# Patient Record
Sex: Male | Born: 1950 | Race: White | Hispanic: No | Marital: Married | Smoking: Never smoker
Health system: Southern US, Community
[De-identification: ages and names within clinical notes are randomized; demographics above are authoritative.]

## PROBLEM LIST (undated history)

## (undated) DIAGNOSIS — Z87442 Personal history of urinary calculi: Secondary | ICD-10-CM

## (undated) DIAGNOSIS — N400 Enlarged prostate without lower urinary tract symptoms: Secondary | ICD-10-CM

## (undated) HISTORY — PX: TONSILLECTOMY AND ADENOIDECTOMY: SUR1326

---

## 1980-08-22 HISTORY — PX: SHOULDER SURGERY: SHX246

## 2012-08-22 HISTORY — PX: EYE SURGERY: SHX253

## 2015-01-12 DIAGNOSIS — L720 Epidermal cyst: Secondary | ICD-10-CM | POA: Diagnosis not present

## 2015-01-12 DIAGNOSIS — Z1283 Encounter for screening for malignant neoplasm of skin: Secondary | ICD-10-CM | POA: Diagnosis not present

## 2015-01-12 DIAGNOSIS — D485 Neoplasm of uncertain behavior of skin: Secondary | ICD-10-CM | POA: Diagnosis not present

## 2015-01-12 DIAGNOSIS — D2239 Melanocytic nevi of other parts of face: Secondary | ICD-10-CM | POA: Diagnosis not present

## 2015-01-12 DIAGNOSIS — L821 Other seborrheic keratosis: Secondary | ICD-10-CM | POA: Diagnosis not present

## 2015-01-12 DIAGNOSIS — L700 Acne vulgaris: Secondary | ICD-10-CM | POA: Diagnosis not present

## 2015-01-12 DIAGNOSIS — D229 Melanocytic nevi, unspecified: Secondary | ICD-10-CM | POA: Diagnosis not present

## 2015-01-12 DIAGNOSIS — L570 Actinic keratosis: Secondary | ICD-10-CM | POA: Diagnosis not present

## 2015-01-12 DIAGNOSIS — L578 Other skin changes due to chronic exposure to nonionizing radiation: Secondary | ICD-10-CM | POA: Diagnosis not present

## 2015-05-11 DIAGNOSIS — H02839 Dermatochalasis of unspecified eye, unspecified eyelid: Secondary | ICD-10-CM | POA: Diagnosis not present

## 2016-05-30 DIAGNOSIS — N390 Urinary tract infection, site not specified: Secondary | ICD-10-CM | POA: Diagnosis not present

## 2016-05-30 DIAGNOSIS — E119 Type 2 diabetes mellitus without complications: Secondary | ICD-10-CM | POA: Diagnosis not present

## 2016-05-30 DIAGNOSIS — E039 Hypothyroidism, unspecified: Secondary | ICD-10-CM | POA: Diagnosis not present

## 2016-05-30 DIAGNOSIS — Z139 Encounter for screening, unspecified: Secondary | ICD-10-CM | POA: Diagnosis not present

## 2016-05-30 DIAGNOSIS — R300 Dysuria: Secondary | ICD-10-CM | POA: Diagnosis not present

## 2016-05-30 DIAGNOSIS — I10 Essential (primary) hypertension: Secondary | ICD-10-CM | POA: Diagnosis not present

## 2016-05-30 DIAGNOSIS — B9681 Helicobacter pylori [H. pylori] as the cause of diseases classified elsewhere: Secondary | ICD-10-CM | POA: Diagnosis not present

## 2016-05-30 DIAGNOSIS — R799 Abnormal finding of blood chemistry, unspecified: Secondary | ICD-10-CM | POA: Diagnosis not present

## 2016-05-30 DIAGNOSIS — E7800 Pure hypercholesterolemia, unspecified: Secondary | ICD-10-CM | POA: Diagnosis not present

## 2016-05-30 DIAGNOSIS — N419 Inflammatory disease of prostate, unspecified: Secondary | ICD-10-CM | POA: Diagnosis not present

## 2016-05-30 DIAGNOSIS — Z0000 Encounter for general adult medical examination without abnormal findings: Secondary | ICD-10-CM | POA: Diagnosis not present

## 2016-06-03 ENCOUNTER — Inpatient Hospital Stay (HOSPITAL_COMMUNITY): Payer: Medicare Other

## 2016-06-03 ENCOUNTER — Encounter (HOSPITAL_COMMUNITY): Payer: Self-pay | Admitting: *Deleted

## 2016-06-03 ENCOUNTER — Inpatient Hospital Stay (HOSPITAL_COMMUNITY)
Admission: EM | Admit: 2016-06-03 | Discharge: 2016-06-04 | DRG: 684 | Disposition: A | Discharge status: Home / Self Care | Payer: Medicare Other | Attending: Family Medicine | Admitting: Family Medicine

## 2016-06-03 DIAGNOSIS — N139 Obstructive and reflux uropathy, unspecified: Secondary | ICD-10-CM | POA: Diagnosis present

## 2016-06-03 DIAGNOSIS — R112 Nausea with vomiting, unspecified: Secondary | ICD-10-CM | POA: Diagnosis present

## 2016-06-03 DIAGNOSIS — R338 Other retention of urine: Secondary | ICD-10-CM | POA: Diagnosis not present

## 2016-06-03 DIAGNOSIS — R197 Diarrhea, unspecified: Secondary | ICD-10-CM | POA: Diagnosis present

## 2016-06-03 DIAGNOSIS — Z7982 Long term (current) use of aspirin: Secondary | ICD-10-CM | POA: Diagnosis not present

## 2016-06-03 DIAGNOSIS — Z79899 Other long term (current) drug therapy: Secondary | ICD-10-CM

## 2016-06-03 DIAGNOSIS — R339 Retention of urine, unspecified: Secondary | ICD-10-CM | POA: Diagnosis present

## 2016-06-03 DIAGNOSIS — N400 Enlarged prostate without lower urinary tract symptoms: Secondary | ICD-10-CM | POA: Diagnosis present

## 2016-06-03 DIAGNOSIS — R972 Elevated prostate specific antigen [PSA]: Secondary | ICD-10-CM | POA: Diagnosis not present

## 2016-06-03 DIAGNOSIS — N19 Unspecified kidney failure: Secondary | ICD-10-CM | POA: Diagnosis present

## 2016-06-03 DIAGNOSIS — N179 Acute kidney failure, unspecified: Secondary | ICD-10-CM | POA: Diagnosis present

## 2016-06-03 DIAGNOSIS — N178 Other acute kidney failure: Secondary | ICD-10-CM | POA: Diagnosis not present

## 2016-06-03 LAB — URINALYSIS, ROUTINE W REFLEX MICROSCOPIC
Bilirubin Urine: NEGATIVE
Glucose, UA: NEGATIVE mg/dL
KETONES UR: NEGATIVE mg/dL
LEUKOCYTES UA: NEGATIVE
NITRITE: NEGATIVE
PROTEIN: NEGATIVE mg/dL
Specific Gravity, Urine: 1.017 (ref 1.005–1.030)
pH: 5.5 (ref 5.0–8.0)

## 2016-06-03 LAB — PSA: PSA: 20.86 ng/mL — ABNORMAL HIGH (ref 0.00–4.00)

## 2016-06-03 LAB — URINE MICROSCOPIC-ADD ON

## 2016-06-03 LAB — CBC WITH DIFFERENTIAL/PLATELET
Basophils Absolute: 0 10*3/uL (ref 0.0–0.1)
Basophils Relative: 0 %
EOS PCT: 0 %
Eosinophils Absolute: 0 10*3/uL (ref 0.0–0.7)
HCT: 40.9 % (ref 39.0–52.0)
Hemoglobin: 14.7 g/dL (ref 13.0–17.0)
LYMPHS ABS: 0.6 10*3/uL — AB (ref 0.7–4.0)
LYMPHS PCT: 5 %
MCH: 29.6 pg (ref 26.0–34.0)
MCHC: 35.9 g/dL (ref 30.0–36.0)
MCV: 82.3 fL (ref 78.0–100.0)
MONO ABS: 1.9 10*3/uL — AB (ref 0.1–1.0)
MONOS PCT: 18 %
Neutro Abs: 8.1 10*3/uL — ABNORMAL HIGH (ref 1.7–7.7)
Neutrophils Relative %: 77 %
PLATELETS: 158 10*3/uL (ref 150–400)
RBC: 4.97 MIL/uL (ref 4.22–5.81)
RDW: 13.6 % (ref 11.5–15.5)
WBC: 10.6 10*3/uL — AB (ref 4.0–10.5)

## 2016-06-03 LAB — COMPREHENSIVE METABOLIC PANEL
ALBUMIN: 4 g/dL (ref 3.5–5.0)
ALT: 22 U/L (ref 17–63)
AST: 14 U/L — AB (ref 15–41)
Alkaline Phosphatase: 47 U/L (ref 38–126)
Anion gap: 21 — ABNORMAL HIGH (ref 5–15)
BUN: 101 mg/dL — AB (ref 6–20)
CHLORIDE: 98 mmol/L — AB (ref 101–111)
CO2: 15 mmol/L — AB (ref 22–32)
Calcium: 8.7 mg/dL — ABNORMAL LOW (ref 8.9–10.3)
Creatinine, Ser: 10.41 mg/dL — ABNORMAL HIGH (ref 0.61–1.24)
GFR calc Af Amer: 5 mL/min — ABNORMAL LOW (ref >60)
GFR calc non Af Amer: 4 mL/min — ABNORMAL LOW (ref >60)
GLUCOSE: 115 mg/dL — AB (ref 65–99)
Potassium: 4.9 mmol/L (ref 3.5–5.1)
Sodium: 134 mmol/L — ABNORMAL LOW (ref 135–145)
Total Bilirubin: 0.8 mg/dL (ref 0.3–1.2)
Total Protein: 6.9 g/dL (ref 6.5–8.1)

## 2016-06-03 MED ORDER — ACETAMINOPHEN 325 MG PO TABS: 650.0000 mg | ORAL_TABLET | Freq: Four times a day (QID) | ORAL | Status: DC | PRN

## 2016-06-03 MED ORDER — ACETAMINOPHEN 650 MG RE SUPP: 650.0000 mg | Freq: Four times a day (QID) | RECTAL | Status: DC | PRN

## 2016-06-03 MED ORDER — TAMSULOSIN HCL 0.4 MG PO CAPS
0.4000 mg | ORAL_CAPSULE | Freq: Every day | ORAL | Status: DC
  Administered 2016-06-03: 0.4 mg via ORAL
  Filled 2016-06-03: qty 1

## 2016-06-03 MED ORDER — SODIUM CHLORIDE 0.9 % IV BOLUS (SEPSIS)
500.0000 mL | Freq: Once | INTRAVENOUS | Status: AC
  Administered 2016-06-03: 500 mL via INTRAVENOUS

## 2016-06-03 MED ORDER — SODIUM CHLORIDE 0.9 % IV SOLN
INTRAVENOUS | Status: DC
  Administered 2016-06-04: 02:00:00 via INTRAVENOUS

## 2016-06-03 MED ORDER — HEPARIN SODIUM (PORCINE) 5000 UNIT/ML IJ SOLN: 5000.0000 [IU] | Freq: Three times a day (TID) | INTRAMUSCULAR | Status: DC

## 2016-06-03 MED ORDER — SODIUM CHLORIDE 0.9 % IV SOLN
INTRAVENOUS | Status: DC
  Administered 2016-06-03: 16:00:00 via INTRAVENOUS

## 2016-06-03 NOTE — ED Notes (Signed)
Bed: WA04 Expected date:  Expected time:  Means of arrival:  Comments: 

## 2016-06-03 NOTE — ED Notes (Signed)
Bed: WHALA Expected date:  Expected time:  Means of arrival:  Comments: 

## 2016-06-03 NOTE — ED Triage Notes (Signed)
Pt complains of decreased urination, abdominal pain and diarrhea for the past 6 days. Pt states he went to a clinic 4 days ago and tested positive for h pylori, was prescribed cipro, flagyl and omeprazole. Pt states he has had hx of prostate issues. Pt's PSA was 15.   Pt's bladder scan in triage >961ml.

## 2016-06-03 NOTE — ED Provider Notes (Signed)
[facility] DEPT Provider Note   CSN: [id number] Arrival date & time: 06/03/16  1124     History   Chief Complaint Chief Complaint  Patient presents with  . Diarrhea  . Urinary Retention    HPI Steven Coleman is a 65 y.o. male.  HPI Patient with decreased urination. Has had it for the last 6 days. States he will only dribble a little bit of time. States a few days ago he went to clinic and was given Cipro Flagyl and omeprazole for his abdominal pain and diarrhea. No fevers. Has a history of prostate issues. His PSA was 15. Found to be in urinary retention here.   History reviewed. No pertinent past medical history.  Patient Active Problem List   Diagnosis Date Noted  . Renal failure 06/03/2016    History reviewed. No pertinent surgical history.     Home Medications    Prior to Admission medications   Not on File    Family History No family history on file.  Social History Social History  Substance Use Topics  . Smoking status: Never Smoker  . Smokeless tobacco: Never Used  . Alcohol use No     Allergies   Review of patient's allergies indicates not on file.   Review of Systems Review of Systems  Constitutional: Negative for appetite change.  HENT: Negative for congestion.   Respiratory: Negative for shortness of breath.   Cardiovascular: Negative for chest pain.  Gastrointestinal: Positive for abdominal pain and diarrhea.  Genitourinary: Positive for dysuria.  Musculoskeletal: Negative for back pain.  Neurological: Negative for light-headedness.  Psychiatric/Behavioral: Negative for agitation.     Physical Exam Updated Vital Signs BP 139/87 (BP Location: Right Arm)   Pulse 74   Temp 97.8 F (36.6 C) (Oral)   Resp 18   Wt 176 lb (79.8 kg)   SpO2 99%   Physical Exam  Constitutional: He appears well-developed.  HENT:  Head: Atraumatic.  Eyes: Pupils are equal, round, and reactive to light.  Cardiovascular: Normal rate.     Pulmonary/Chest: Effort normal.  Abdominal: He exhibits mass. There is tenderness.  Large lower abdominal mass to above umbilicus.  Musculoskeletal: Normal range of motion.  Neurological: He is alert.  Skin: Skin is warm. Capillary refill takes less than 2 seconds.     ED Treatments / Results  Labs (all labs ordered are listed, but only abnormal results are displayed) Labs Reviewed  URINALYSIS, ROUTINE W REFLEX MICROSCOPIC (NOT AT [facility] - Abnormal; Notable for the following:       Result Value   Hgb urine dipstick SMALL (*)    All other components within normal limits  COMPREHENSIVE METABOLIC PANEL - Abnormal; Notable for the following:    Sodium 134 (*)    Chloride 98 (*)    CO2 15 (*)    Glucose, Bld 115 (*)    BUN 101 (*)    Creatinine, Ser 10.41 (*)    Calcium 8.7 (*)    AST 14 (*)    GFR calc non Af Amer 4 (*)    GFR calc Af Amer 5 (*)    Anion gap 21 (*)    All other components within normal limits  CBC WITH DIFFERENTIAL/PLATELET - Abnormal; Notable for the following:    WBC 10.6 (*)    Neutro Abs 8.1 (*)    Lymphs Abs 0.6 (*)    Monocytes Absolute 1.9 (*)    All other components within normal limits  URINE  MICROSCOPIC-ADD ON - Abnormal; Notable for the following:    Squamous Epithelial / LPF 0-5 (*)    Bacteria, UA RARE (*)    All other components within normal limits    EKG  EKG Interpretation None       Radiology No results found.  Procedures Procedures (including critical care time)  Medications Ordered in ED Medications  sodium chloride 0.9 % bolus 500 mL (500 mLs Intravenous New Bag/Given 06/03/16 1422)  sodium chloride 0.9 % bolus 500 mL (0 mLs Intravenous Stopped 06/03/16 1422)     Initial Impression / Assessment and Plan / ED Course  I have reviewed the triage vital signs and the nursing notes.  Pertinent labs & imaging results that were available during my care of the patient were reviewed by me and considered in my medical decision  making (see chart for details).  Clinical Course    Patient with urinary retention. Likely has had over the last 6 days. Patient feels much better after Foley catheter. Has had almost 2 L out. He was however found to be in renal failure. Likely bladder outlet obstruction as a cause but also had some nausea vomiting and diarrhea and could have a prerenal component. Discussed with Dr. Lorrene Reid who thinks patient can stay at [facility]. Will admit to internal medicine.    Final Clinical Impressions(s) / ED Diagnoses   Final diagnoses:  Acute urinary retention  Acute kidney injury [facility])    New Prescriptions New Prescriptions   No medications on file     Davonna Belling, MD 06/03/16 1432

## 2016-06-03 NOTE — H&P (Addendum)
[facility] H&P   Patient Demographics:    Steven Coleman, is a 65 y.o. male  MRN: [id number]   DOB - Mar 17, 1951  Admit Date - 06/03/2016  Outpatient Primary MD for the patient is No primary care provider on file.  Referring MD/NP/PA: dr Pickiring   Patient coming from: Home  Chief Complaint  Patient presents with  . Diarrhea  . Urinary Retention      HPI:    Steven Coleman  is a 65 y.o. male, With no significant past medical history, does not follow with PCP, presents with multiple complaints, including abdominal pain, patient reports last week he had an episode of diarrhea, where he went to be seen in urgent care, he was told he has H. Pylori, where he was given Cipro and Flagyl??, But patient reports he could not take any by mouth antibiotics, as well cannot tolerate an oral intake given significant nausea and vomiting, denies any fever, chills, blood in stools, melena, coffee-ground emesis, patient workup was significant for acute renal failure with a creatinine of 10, new onset, was found to have bladder distention, where he had Foley catheter inserted with 2.5 L urine output, patient reports he never had prostate problem, but he mentions when he wets at the urgent care, they have told him his PCA was 62, ED physician discussed with Dr. Lorrene Reid, report patient can stay at [facility], and to have labs monitored closely, start on IV fluids and monitor for postobstructive diuresis, and if no improvement of renal function, then  they can then they can consult officially.    Review of systems:    In addition to the HPI above, No Fever-chills, No Headache, No changes with Vision or hearing, No problems swallowing food or Liquids, No Chest pain, Cough or Shortness of Breath, Patient reports abdominal pain, reports nausea vomiting and diarrhea last week, but has resolved since No Blood in  stool or Urine, No dysuria, No new skin rashes or bruises, No new joints pains-aches,  No new weakness, tingling, numbness in any extremity, No recent weight gain or loss, No polyuria, polydypsia or polyphagia, No significant Mental Stressors.  A full 10 point Review of Systems was done, except as stated above, all other Review of Systems were negative.   With Past History of the following :    History reviewed. No pertinent past medical history.    History reviewed. No pertinent surgical history.    Social History:     Social History  Substance Use Topics  . Smoking status: Never Smoker  . Smokeless tobacco: Never Used  . Alcohol use No     Lives - At home  Mobility - independent     Family History :   No family history of prostate cancer   Home Medications:   Prior to Admission medications   Medication Sig Start Date End Date Taking? Authorizing Provider  aspirin 325 MG tablet Take 325 mg by mouth daily.    Yes Historical Provider, MD  aspirin 325 MG tablet Take 650 mg by mouth every 6 (six) hours as needed for mild pain.   Yes Historical Provider, MD  Carboxymethylcellul-Glycerin (CLEAR EYES FOR DRY EYES) 1-0.25 % SOLN Place 1-2 drops into both eyes daily as needed (for dry eyes).   Yes Historical Provider, MD  Misc Natural Products (PROSTATE SUPPORT PO) Take 2 tablets by mouth daily.   Yes Historical Provider, MD     Allergies:    No Known Allergies   Physical Exam:   Vitals  Blood pressure 139/87, pulse 74, temperature 97.8 F (36.6 C), temperature source Oral, resp. rate 18, weight 79.8 kg (176 lb), SpO2 99 %.   1. General Well-developed male lying in bed in NAD,    2. Normal affect and insight, Not Suicidal or Homicidal, Awake Alert, Oriented X 3.  3. No F.N deficits, ALL C.Nerves Intact, Strength 5/5 all 4 extremities, Sensation intact all 4 extremities, Plantars down going.  4. Ears and Eyes appear Normal, Conjunctivae clear, PERRLA. Moist  Oral Mucosa.  5. Supple Neck, No JVD, No cervical lymphadenopathy appriciated, No Carotid Bruits.  6. Symmetrical Chest wall movement, Good air movement bilaterally, CTAB.  7. RRR, No Gallops, Rubs or Murmurs, No Parasternal Heave.  8. Positive Bowel Sounds, Abdomen Soft, No tenderness, No organomegaly appriciated,No rebound -guarding or rigidity.  9.  No Cyanosis, Normal Skin Turgor, No Skin Rash or Bruise.  10. Good muscle tone,  joints appear normal , no effusions, Normal ROM.    Data Review:    CBC  Recent Labs Lab 06/03/16 1218  WBC 10.6*  HGB 14.7  HCT 40.9  PLT 158  MCV 82.3  MCH 29.6  MCHC 35.9  RDW 13.6  LYMPHSABS 0.6*  MONOABS 1.9*  EOSABS 0.0  BASOSABS 0.0   ------------------------------------------------------------------------------------------------------------------  Chemistries   Recent Labs Lab 06/03/16 1218  NA 134*  K 4.9  CL 98*  CO2 15*  GLUCOSE 115*  BUN 101*  CREATININE 10.41*  CALCIUM 8.7*  AST 14*  ALT 22  ALKPHOS 47  BILITOT 0.8   ------------------------------------------------------------------------------------------------------------------ CrCl cannot be calculated (Unknown ideal weight.). ------------------------------------------------------------------------------------------------------------------ No results for input(s): TSH, T4TOTAL, T3FREE, THYROIDAB in the last 72 hours.  Invalid input(s): FREET3  Coagulation profile No results for input(s): INR, PROTIME in the last 168 hours. ------------------------------------------------------------------------------------------------------------------- No results for input(s): DDIMER in the last 72 hours. -------------------------------------------------------------------------------------------------------------------  Cardiac Enzymes No results for input(s): CKMB, TROPONINI, MYOGLOBIN in the last 168 hours.  Invalid input(s):  CK ------------------------------------------------------------------------------------------------------------------ No results found for: BNP   ---------------------------------------------------------------------------------------------------------------  Urinalysis    Component Value Date/Time   COLORURINE YELLOW 06/03/2016 [place] 06/03/2016 1214   LABSPEC 1.017 06/03/2016 1214   PHURINE 5.5 06/03/2016 1214   GLUCOSEU NEGATIVE 06/03/2016 1214   HGBUR SMALL (A) 06/03/2016 [place] 06/03/2016 1214   KETONESUR NEGATIVE 06/03/2016 1214   PROTEINUR NEGATIVE 06/03/2016 1214   NITRITE NEGATIVE 06/03/2016 1214   LEUKOCYTESUR NEGATIVE 06/03/2016 1214    ----------------------------------------------------------------------------------------------------------------   Imaging Results:    No results found.    Assessment & Plan:    Active Problems:   Acute renal failure (ARF) (HCC)   Elevated PSA    Acute renal failure - This is most likely related to obstructive uropathy most likely due to BPH, Foley catheter inserted in ED, so far 2.5 L drained, ED physician discussed with  renal Dr. Lorrene Reid, recommendation for IV fluid, monitor electrolytes closely on a daily basis, as patient is expected to recover with relief of obstruction, and IV fluid, and very likely would have postobstructive diuresis where he will need electrolytes replacement, if no improvement of renal function, then nephrology will need to be officially consulted. - Follow on renal ultrasound  Elevated PSA - As per patient, they report PSA was 15 at urgent care center, so we'll repeat PSA while inpatient to confirm, Discussed with Dr. Junious Silk , if ultrasound with no acute findings, and patient renal function continues to improve, then he can be followed as an outpatient with him .  Nausea vomiting and diarrhea - Patient reports episode last week, most likely due to viral illness,  currently resolved with no recurrence, will monitor closely     DVT Prophylaxis Heparin - SCDs  AM Labs Ordered, also please review Full Orders  Family Communication: Admission, patients condition and plan of care including tests being ordered have been discussed with the patient and wife who indicate understanding and agree with the plan and Code Status.  Code Status Full  Likely DC to  Home  Condition GUARDED   Consults called: ED D/W renal  Admission status: inpatient  Time spent in minutes : 55 Minutes   Allene Furuya M.D on 06/03/2016 at 2:53 PM  Between 7am to 7pm - Pager - [phone]. After 7pm go to [email or web address] - password [email or web address]  [facility] - Office  [phone]

## 2016-06-04 DIAGNOSIS — R338 Other retention of urine: Secondary | ICD-10-CM | POA: Diagnosis present

## 2016-06-04 DIAGNOSIS — R972 Elevated prostate specific antigen [PSA]: Secondary | ICD-10-CM

## 2016-06-04 DIAGNOSIS — N178 Other acute kidney failure: Secondary | ICD-10-CM

## 2016-06-04 LAB — BASIC METABOLIC PANEL
ANION GAP: 5 (ref 5–15)
BUN: 40 mg/dL — ABNORMAL HIGH (ref 6–20)
CO2: 25 mmol/L (ref 22–32)
Calcium: 8.3 mg/dL — ABNORMAL LOW (ref 8.9–10.3)
Chloride: 113 mmol/L — ABNORMAL HIGH (ref 101–111)
Creatinine, Ser: 1.44 mg/dL — ABNORMAL HIGH (ref 0.61–1.24)
GFR calc Af Amer: 57 mL/min — ABNORMAL LOW (ref >60)
GFR calc non Af Amer: 49 mL/min — ABNORMAL LOW (ref >60)
GLUCOSE: 105 mg/dL — AB (ref 65–99)
POTASSIUM: 4.3 mmol/L (ref 3.5–5.1)
Sodium: 143 mmol/L (ref 135–145)

## 2016-06-04 LAB — CBC
HEMATOCRIT: 37.2 % — AB (ref 39.0–52.0)
HEMOGLOBIN: 12.9 g/dL — AB (ref 13.0–17.0)
MCH: 28.8 pg (ref 26.0–34.0)
MCHC: 34.7 g/dL (ref 30.0–36.0)
MCV: 83 fL (ref 78.0–100.0)
Platelets: 147 10*3/uL — ABNORMAL LOW (ref 150–400)
RBC: 4.48 MIL/uL (ref 4.22–5.81)
RDW: 13.8 % (ref 11.5–15.5)
WBC: 7 10*3/uL (ref 4.0–10.5)

## 2016-06-04 MED ORDER — TAMSULOSIN HCL 0.4 MG PO CAPS: 0.4000 mg | ORAL_CAPSULE | Freq: Every day | ORAL | 0 refills | Status: DC

## 2016-06-04 NOTE — Discharge Summary (Signed)
Physician Discharge Summary  Steven Coleman [id number] DOB: 12/25/1950 DOA: 06/03/2016  PCP: No primary care provider on file. Urologist: Dr. Junious Silk   Admit date: 06/03/2016 Discharge date: 06/04/2016  Admitted From: Home Disposition:  Home   Recommendations for Outpatient Follow-up:  1. Follow up with urologist Dr. Junious Silk in 5 days  2. Please obtain BMP on follow up   Discharge Condition: Stable  CODE STATUS: FULL  Brief/Interim Summary: Steven Coleman  is a 65 y.o. male, With no significant past medical history, does not follow with PCP, presents with multiple complaints, including abdominal pain, patient reports last week he had an episode of diarrhea, where he went to be seen in urgent care, he was told he has H. Pylori, where he was given Cipro and Flagyl??, But patient reports he could not take any by mouth antibiotics, as well cannot tolerate an oral intake given significant nausea and vomiting, denies any fever, chills, blood in stools, melena, coffee-ground emesis, patient workup was significant for acute renal failure with a creatinine of 10, new onset, was found to have bladder distention, where he had Foley catheter inserted with 3.5 L urine output, patient reports he never had prostate problem, but he mentions when he wets at the urgent care, they have told him his PCA was 1, ED physician discussed with Dr. Lorrene Reid, report patient can stay at [facility], and to have labs monitored closely, started on IV fluids and monitor postobstructive diuresis, and if no improvement of renal function, then  they can then they can consult officially.  Acute renal failure - This is most likely related to obstructive uropathy most likely due to BPH, Foley catheter inserted in ED, and 2.5 L drained, ED physician discussed with nephrologist Dr. Lorrene Reid, Creatinine improved from 10 to 1 overnight with placement of foley catheter.  Pt had a renal ultrasound and no acute findings reported.    Pt will be discharged with foley catheter and will follow up with urologist Dr. Junious Silk.  Pt anxious to go home.    Elevated PSA - As per patient, they report PSA was 15 at urgent care center, so we'll repeat PSA while inpatient to confirm, Discussed with Dr. Junious Silk , if ultrasound with no acute findings, and patient renal function continues to improve, then he can be followed as an outpatient with him.   Nausea vomiting and diarrhea - Patient reports episode last week, most likely due to viral illness, currently resolved with no recurrence.    DVT Prophylaxis Heparin - SCDs   Family Communication: Admission, patients condition and plan of care including tests being ordered have been discussed with the patient and wife who indicate understanding and agree with the plan and Code Status.   Discharge Diagnoses:  Active Problems:   Acute renal failure (ARF) (HCC)   Elevated PSA   Acute urinary retention   Discharge Instructions    Medication List    TAKE these medications   aspirin 325 MG tablet Take 325 mg by mouth daily.   aspirin 325 MG tablet Take 650 mg by mouth every 6 (six) hours as needed for mild pain.   CLEAR EYES FOR DRY EYES 1-0.25 % Soln Generic drug:  Carboxymethylcellul-Glycerin Place 1-2 drops into both eyes daily as needed (for dry eyes).   PROSTATE SUPPORT PO Take 2 tablets by mouth daily.   tamsulosin 0.4 MG Caps capsule Commonly known as:  FLOMAX Take 1 capsule (0.4 mg total) by mouth daily after supper.  Follow-up Information    ESKRIDGE, MATTHEW, MD. Schedule an appointment as soon as possible for a visit in 5 day(s).   Specialty:  Urology Why:  Hospital Follow Up  Contact information: [place] [address] [phone]          No Known Allergies  Procedures/Studies: US Renal  Result Date: 06/03/2016 CLINICAL DATA:  Acute renal failure. EXAM: RENAL / URINARY TRACT ULTRASOUND COMPLETE COMPARISON:  None.  FINDINGS: Right Kidney: Length: 12.5 cm. Echogenicity within normal limits. No mass or hydronephrosis visualized. Left Kidney: Length: 12.7 cm. Left renal parenchymal echogenicity and thickness are within normal limits. There is a 0.6 cm hyperechoic focus in the upper left renal cortex, which appears to represent layering milk of calcium or mural calcification within a tiny renal cortical cyst. No left hydronephrosis. Bladder: Bladder is completely collapsed by indwelling Foley catheter and cannot be evaluated on this scan. IMPRESSION: 1. No hydronephrosis. 2. Subcentimeter hyperechoic focus in the upper left renal cortex, favor a minimally complex renal cyst with layering milk of calcium or mural calcification. Recommend attention on a follow-up renal sonogram in 6-12 months. 3. Normal size kidneys with normal renal parenchymal echogenicity. 4. Bladder collapsed by indwelling Foley catheter and not evaluated on this scan. Electronically Signed   By: Ilona Sorrel M.D.   On: 06/03/2016 16:44    Subjective: Pt says he feels much better, anxious to go home.  Says he will be sure to follow up as recommended.  Wife at bedside and confirms.  Pt willing to go home with foley cath.    Discharge Exam: Vitals:   06/03/16 2215 06/04/16 0608  BP: 118/62 138/74  Pulse: 98 78  Resp: 18 18  Temp: 98.3 F (36.8 C) 98 F (36.7 C)   Vitals:   06/03/16 1556 06/03/16 1651 06/03/16 2215 06/04/16 0608  BP: (!) 171/92  118/62 138/74  Pulse: 75  98 78  Resp: 20  18 18   Temp: 98 F (36.7 C)  98.3 F (36.8 C) 98 F (36.7 C)  TempSrc: Oral  Oral Axillary  SpO2: 99%  97% 97%  Weight:  77.3 kg (170 lb 8 oz)    Height:  5' 9.5" (1.765 m)      General: Pt is alert, awake, not in acute distress Cardiovascular: RRR, S1/S2 +, no rubs, no gallops Respiratory: CTA bilaterally, no wheezing, no rhonchi Abdominal: Soft, NT, ND, bowel sounds + Extremities: no edema, no cyanosis  The results of significant diagnostics  from this hospitalization (including imaging, microbiology, ancillary and laboratory) are listed below for reference.     Microbiology: No results found for this or any previous visit (from the past 240 hour(s)).   Labs: BNP (last 3 results) No results for input(s): BNP in the last 8760 hours. Basic Metabolic Panel:  Recent Labs Lab 06/03/16 1218 06/04/16 0553  NA 134* 143  K 4.9 4.3  CL 98* 113*  CO2 15* 25  GLUCOSE 115* 105*  BUN 101* 40*  CREATININE 10.41* 1.44*  CALCIUM 8.7* 8.3*   Liver Function Tests:  Recent Labs Lab 06/03/16 1218  AST 14*  ALT 22  ALKPHOS 47  BILITOT 0.8  PROT 6.9  ALBUMIN 4.0   No results for input(s): LIPASE, AMYLASE in the last 168 hours. No results for input(s): AMMONIA in the last 168 hours. CBC:  Recent Labs Lab 06/03/16 1218 06/04/16 0553  WBC 10.6* 7.0  NEUTROABS 8.1*  --   HGB 14.7 12.9*  HCT 40.9 37.2*  MCV 82.3 83.0  PLT 158 147*   Cardiac Enzymes: No results for input(s): CKTOTAL, CKMB, CKMBINDEX, TROPONINI in the last 168 hours. BNP: Invalid input(s): POCBNP CBG: No results for input(s): GLUCAP in the last 168 hours. D-Dimer No results for input(s): DDIMER in the last 72 hours. Hgb A1c No results for input(s): HGBA1C in the last 72 hours. Lipid Profile No results for input(s): CHOL, HDL, LDLCALC, TRIG, CHOLHDL, LDLDIRECT in the last 72 hours. Thyroid function studies No results for input(s): TSH, T4TOTAL, T3FREE, THYROIDAB in the last 72 hours.  Invalid input(s): FREET3 Anemia work up No results for input(s): VITAMINB12, FOLATE, FERRITIN, TIBC, IRON, RETICCTPCT in the last 72 hours. Urinalysis    Component Value Date/Time   COLORURINE YELLOW 06/03/2016 [place] 06/03/2016 1214   LABSPEC 1.017 06/03/2016 1214   PHURINE 5.5 06/03/2016 1214   GLUCOSEU NEGATIVE 06/03/2016 1214   HGBUR SMALL (A) 06/03/2016 1214   BILIRUBINUR NEGATIVE 06/03/2016 1214   KETONESUR NEGATIVE 06/03/2016 1214    PROTEINUR NEGATIVE 06/03/2016 1214   NITRITE NEGATIVE 06/03/2016 1214   LEUKOCYTESUR NEGATIVE 06/03/2016 1214   Sepsis Labs Invalid input(s): PROCALCITONIN,  WBC,  LACTICIDVEN Microbiology No results found for this or any previous visit (from the past 240 hour(s)).  Time coordinating discharge: 28 minutes  SIGNED:  Irwin Brakeman, MD  [facility] 06/04/2016, 9:10 AM Pager   If 7PM-7AM, please contact night-coverage [email or web address] Password [email or web address]

## 2016-06-04 NOTE — Discharge Instructions (Signed)
Acute Urinary Retention, Male Acute urinary retention is when you are unable to pee (urinate). Acute urinary retention is common in older men. Prostates can get bigger, which blocks the flow of pee.  HOME CARE  Drink enough fluids to keep your pee clear or pale yellow.  If you are sent home with a tube that drains the bladder (catheter), there will be a drainage bag attached to it. There are two types of bags. One is big that you can wear at night without having to empty it. One is smaller and needs to be emptied more often.  Keep the drainage bag empty.  Keep the drainage bag lower than your catheter.  Only take medicine as told by your doctor. GET HELP IF:  You have a low-grade fever.  You have spasms or you are leaking pee when you have spasms. GET HELP RIGHT AWAY IF:   You have chills or a fever.  Your catheter stops draining pee.  Your catheter falls out.  You have increased bleeding that does not stop after you have rested and increased the amount of fluids you had been drinking. MAKE SURE YOU:   Understand these instructions.  Will watch your condition.  Will get help right away if you are not doing well or get worse.   This information is not intended to replace advice given to you by your health care provider. Make sure you discuss any questions you have with your health care provider.   Document Released: 01/25/2008 Document Revised: 12/23/2014 Document Reviewed: 01/17/2013 Elsevier Interactive Patient Education 2016 [place], Adult A Foley catheter is a soft, flexible tube. This tube is placed into your bladder to drain pee (urine). If you go home with this catheter in place, follow the instructions below. TAKING CARE OF THE CATHETER 1. Wash your hands with soap and water. 2. Put soap and water on a clean washcloth.  Clean the skin where the tube goes into your body.  Clean away from the tube site.  Never wipe toward the  tube.  Clean the area using a circular motion.  Remove all the soap. Pat the area dry with a clean towel. For males, reposition the skin that covers the end of the penis (foreskin). 3. Attach the tube to your leg with tape or a leg strap. Do not stretch the tube tight. If you are using tape, remove any stickiness left behind by past tape you used. 4. Keep the drainage bag below your hips. Keep it off the floor. 5. Check your tube during the day. Make sure it is working and draining. Make sure the tube does not curl, twist, or bend. 6. Do not pull on the tube or try to take it out. TAKING CARE OF THE DRAINAGE BAGS You will have a large overnight drainage bag and a small leg bag. You may wear the overnight bag any time. Never wear the small bag at night. Follow the directions below. Emptying the Drainage Bag Empty your drainage bag when it is  - full or at least 2-3 times a day. 1. Wash your hands with soap and water. 2. Keep the drainage bag below your hips. 3. Hold the dirty bag over the toilet or clean container. 4. Open the pour spout at the bottom of the bag. Empty the pee into the toilet or container. Do not let the pour spout touch anything. 5. Clean the pour spout with a gauze pad or cotton ball that has  rubbing alcohol on it. 6. Close the pour spout. 7. Attach the bag to your leg with tape or a leg strap. 8. Wash your hands well. Changing the Drainage Bag Change your bag once a month or sooner if it starts to smell or look dirty.  1. Wash your hands with soap and water. 2. Pinch the rubber tube so that pee does not spill out. 3. Disconnect the catheter tube from the drainage tube at the connection valve. Do not let the tubes touch anything. 4. Clean the end of the catheter tube with an alcohol wipe. Clean the end of a the drainage tube with a different alcohol wipe. 5. Connect the catheter tube to the drainage tube of the clean drainage bag. 6. Attach the new bag to the leg with  tape or a leg strap. Avoid attaching the new bag too tightly. 7. Wash your hands well. Cleaning the Drainage Bag 1. Wash your hands with soap and water. 2. Wash the bag in warm, soapy water. 3. Rinse the bag with warm water. 4. Fill the bag with a mixture of white vinegar and water (1 cup vinegar to 1 quart warm water [.2 liter vinegar to 1 liter warm water]). Close the bag and soak it for 30 minutes in the solution. 5. Rinse the bag with warm water. 6. Hang the bag to dry with the pour spout open and hanging downward. 7. Store the clean bag (once it is dry) in a clean plastic bag. 8. Wash your hands well. PREVENT INFECTION  Wash your hands before and after touching your tube.  Take showers every day. Wash the skin where the tube enters your body. Do not take baths. Replace wet leg straps with dry ones, if this applies.  Do not use powders, sprays, or lotions on the genital area. Only use creams, lotions, or ointments as told by your doctor.  For females, wipe from front to back after going to the bathroom.  Drink enough fluids to keep your pee clear or pale yellow unless you are told not to have too much fluid (fluid restriction).  Do not let the drainage bag or tubing touch or lie on the floor.  Wear cotton underwear to keep the area dry. GET HELP IF:  Your pee is cloudy or smells unusually bad.  Your tube becomes clogged.  You are not draining pee into the bag or your bladder feels full.  Your tube starts to leak. GET HELP RIGHT AWAY IF:  You have pain, puffiness (swelling), redness, or yellowish-white fluid (pus) where the tube enters the body.  You have pain in the belly (abdomen), legs, lower back, or bladder.  You have a fever.  You see blood fill the tube, or your pee is pink or red.  You feel sick to your stomach (nauseous), throw up (vomit), or have chills.  Your tube gets pulled out. MAKE SURE YOU:   Understand these instructions.  Will watch your  condition.  Will get help right away if you are not doing well or get worse.   This information is not intended to replace advice given to you by your health care provider. Make sure you discuss any questions you have with your health care provider.   Document Released: 12/03/2012 Document Revised: 08/29/2014 Document Reviewed: 12/03/2012 Elsevier Interactive Patient Education [employer].

## 2016-06-09 ENCOUNTER — Ambulatory Visit (INDEPENDENT_AMBULATORY_CARE_PROVIDER_SITE_OTHER): Payer: Medicare Other | Admitting: Urology

## 2016-06-09 ENCOUNTER — Encounter: Payer: Self-pay | Admitting: Urology

## 2016-06-09 VITALS — BP 148/82 | HR 78 | Ht 69.75 in | Wt 177.8 lb

## 2016-06-09 DIAGNOSIS — R338 Other retention of urine: Secondary | ICD-10-CM | POA: Diagnosis not present

## 2016-06-09 DIAGNOSIS — R972 Elevated prostate specific antigen [PSA]: Secondary | ICD-10-CM | POA: Diagnosis not present

## 2016-06-09 DIAGNOSIS — N401 Enlarged prostate with lower urinary tract symptoms: Secondary | ICD-10-CM | POA: Diagnosis not present

## 2016-06-09 MED ORDER — TAMSULOSIN HCL 0.4 MG PO CAPS: 0.4000 mg | ORAL_CAPSULE | Freq: Every day | ORAL | 11 refills | Status: DC

## 2016-06-09 NOTE — Progress Notes (Signed)
Fill and Pull Catheter Removal  Patient is present today for a catheter removal.  Patient was cleaned and prepped in a sterile fashion 250 ml of sterile water/ saline was instilled into the bladder when the patient felt the urge to urinate. 8 ml of water was then drained from the balloon.  A 16 FR foley cath was removed from the bladder no complications were noted .  Patient as then given some time to void on their own.  Patient cannot  Void. He only voided out  25 ml on their own after some time.  Patient tolerated well.  Preformed by:K.russell, CMA  Follow up/ Additional notes: The pt was asked to come back after 1:30pm in the afternoon for a PVR and possible foley cath placement.     Simple Catheter Placement  Due to urinary retention patient is present today for a foley cath placement.  Patient was cleaned and prepped in a sterile fashion with betadine.  A 16 FR foley catheter was inserted, urine return was noted  1028ml, urine was dark yellow in color.  The balloon was filled with 10cc of sterile water.  A leg bag was attached for drainage. Patient was also given a night bag to take home and was given instruction on how to change from one bag to another.  Patient was given instruction on proper catheter care.  Patient tolerated well, no complications were noted   Preformed by: Elberta Leatherwood, CMA  Additional notes/ Follow up: 2 weeks for a Voiding trial

## 2016-06-09 NOTE — Progress Notes (Signed)
06/09/2016 9:34 AM   Steven Coleman 29-Dec-1950 [id number]  Referring provider: No referring provider defined for this encounter.  Chief Complaint  Patient presents with  . New Patient (Initial Visit)    urinary retention     HPI: The patient is a 65 year old gentleman presents for follow-up after urinary retention. A Foley catheter was placed. A PSA was drawn at that time that was found be 20.86.  Two liters was drained once the foley was placed. The patient notes at baseline he has a weak stream, straining to urinate, feeling of incomplete bladder emptying, and nocturia. He was unable to go and was found to have that the after mentioned 2 L retention. He has not taken any medications for BPH before however he was started on Flomax in the ED.   PMH: No past medical history on file.  Surgical History: Past Surgical History:  Procedure Laterality Date  . SHOULDER SURGERY Left 1982    Home Medications:    Medication List       Accurate as of 06/09/16  9:34 AM. Always use your most recent med list.          CLEAR EYES FOR DRY EYES 1-0.25 % Soln Generic drug:  Carboxymethylcellul-Glycerin Place 1-2 drops into both eyes daily as needed (for dry eyes).   PROSTATE SUPPORT PO Take 2 tablets by mouth daily.   tamsulosin 0.4 MG Caps capsule Commonly known as:  FLOMAX Take 1 capsule (0.4 mg total) by mouth daily after supper.   tamsulosin 0.4 MG Caps capsule Commonly known as:  FLOMAX Take 1 capsule (0.4 mg total) by mouth daily.       Allergies: No Known Allergies  Family History: Family History  Problem Relation Age of Onset  . Prostate cancer Neg Hx   . Kidney cancer Neg Hx     Social History:  reports that he has never smoked. He has never used smokeless tobacco. He reports that he does not drink alcohol. His drug history is not on file.  ROS: UROLOGY Frequent Urination?: Yes Hard to postpone urination?: Yes Burning/pain with urination?: Yes Get up  at night to urinate?: Yes Leakage of urine?: No Urine stream starts and stops?: No Trouble starting stream?: Yes Do you have to strain to urinate?: Yes Blood in urine?: No Urinary tract infection?: No Sexually transmitted disease?: No Injury to kidneys or bladder?: No Painful intercourse?: No Weak stream?: Yes Erection problems?: Yes Penile pain?: No  Gastrointestinal Nausea?: No Vomiting?: No Indigestion/heartburn?: No Diarrhea?: No Constipation?: No  Constitutional Fever: No Night sweats?: No Weight loss?: Yes Fatigue?: No  Skin Skin rash/lesions?: No Itching?: No  Eyes Blurred vision?: No Double vision?: No  Ears/Nose/Throat Sore throat?: No Sinus problems?: No  Hematologic/Lymphatic Swollen glands?: No Easy bruising?: No  Cardiovascular Leg swelling?: No Chest pain?: No  Respiratory Cough?: No Shortness of breath?: No  Endocrine Excessive thirst?: No  Musculoskeletal Back pain?: No Joint pain?: No  Neurological Headaches?: No Dizziness?: No  Psychologic Depression?: No Anxiety?: No  Physical Exam: BP (!) 148/82   Pulse 78   Ht 5' 9.75" (1.772 m)   Wt 177 lb 12.8 oz (80.6 kg)   BMI 25.69 kg/m   Constitutional:  Alert and oriented, No acute distress. HEENT: [place] AT, moist mucus membranes.  Trachea midline, no masses. Cardiovascular: No clubbing, cyanosis, or edema. Respiratory: Normal respiratory effort, no increased work of breathing. GI: Abdomen is soft, nontender, nondistended, no abdominal masses GU: No CVA tenderness.  Foley in place clear urine. DRE: 3+ benign Skin: No rashes, bruises or suspicious lesions. Lymph: No cervical or inguinal adenopathy. Neurologic: Grossly intact, no focal deficits, moving all 4 extremities. Psychiatric: Normal mood and affect.  Laboratory Data: Lab Results  Component Value Date   WBC 7.0 06/04/2016   HGB 12.9 (L) 06/04/2016   HCT 37.2 (L) 06/04/2016   MCV 83.0 06/04/2016   PLT 147 (L)  06/04/2016    Lab Results  Component Value Date   CREATININE 1.44 (H) 06/04/2016    Lab Results  Component Value Date   PSA 20.86 (H) 06/03/2016    No results found for: TESTOSTERONE  No results found for: HGBA1C  Urinalysis    Component Value Date/Time   COLORURINE YELLOW 06/03/2016 [place] 06/03/2016 1214   LABSPEC 1.017 06/03/2016 1214   PHURINE 5.5 06/03/2016 [place] 06/03/2016 1214   HGBUR SMALL (A) 06/03/2016 1214   [place] 06/03/2016 1214   [place] 06/03/2016 [place] 06/03/2016 1214   NITRITE NEGATIVE 06/03/2016 [place] 06/03/2016 1214     Assessment & Plan:   1. BPH 2. Acute urinary retention Patient unable to void. He will comeback this afternoon for a bladder scan per his request. Foley will be replaced if he cannot void, and he will follow up in 2 weeks. If he passes, he will follow up as below.  3. Elevated PSA His PSA was drawn acute urinary retention with a Foley catheter so is unreliable. We'll have him follow up in 6 weeks with a PSA prior. At that time we'll discuss his PSA as well as his urinary symptoms. This assuming he passing the above voiding trial.   Return in about 6 weeks (around 07/21/2016) for with PSA prior.  Nickie Retort, MD  [facility] [address] [place], [place] [address] [phone]

## 2016-06-23 ENCOUNTER — Ambulatory Visit (INDEPENDENT_AMBULATORY_CARE_PROVIDER_SITE_OTHER): Payer: Medicare Other

## 2016-06-23 VITALS — BP 134/88 | HR 87 | Ht 69.0 in | Wt 176.3 lb

## 2016-06-23 DIAGNOSIS — R338 Other retention of urine: Secondary | ICD-10-CM | POA: Diagnosis not present

## 2016-06-23 NOTE — Progress Notes (Signed)
Fill and Pull Catheter Removal  Patient is present today for a catheter removal.  Patient was cleaned and prepped in a sterile fashion 356ml of sterile water/ saline was instilled into the bladder when the patient felt the urge to urinate. 84ml of water was then drained from the balloon.  A 16FR foley cath was removed from the bladder no complications were noted .  Patient as then given some time to void on their own.  Patient cannot void. Patient tolerated well.  Preformed by: Toniann Fail, LPN   Follow up/ Additional notes: Pt was not able to urinate. Therefore per [place] pt was taught CIC. Pt will cath tid x59mo. Pt will f/u in 42mo with Dr. Pilar Jarvis.   Blood pressure 134/88, pulse 87, height 5\' 9"  (1.753 m), weight 176 lb 4.8 oz (80 kg).

## 2016-06-24 ENCOUNTER — Ambulatory Visit (INDEPENDENT_AMBULATORY_CARE_PROVIDER_SITE_OTHER): Payer: Medicare Other

## 2016-06-24 DIAGNOSIS — R338 Other retention of urine: Secondary | ICD-10-CM

## 2016-06-24 LAB — BLADDER SCAN AMB NON-IMAGING

## 2016-06-24 NOTE — Progress Notes (Signed)
Simple Catheter Placement  Due to urinary retention patient is present today for a foley cath placement.  Patient was cleaned and prepped in a sterile fashion with betadine and lidocaine jelly 2% was instilled into the urethra.  A 18 FR coude foley catheter was inserted, urine return was noted  1270ml, urine was dark red in color.  The balloon was filled with 10cc of sterile water.  A leg bag was attached for drainage. Patient was also given a night bag to take home and was given instruction on how to change from one bag to another.  Patient was given instruction on proper catheter care.  Patient tolerated well, no complications were noted . Cath was irrigated with 259ml of saline to make sure no clots were noted. There were no clots noted and urine was irrigated to a light pink and then attached to the leg bag. Patient and patient's wife were given instruction on how to irrigate and sent home with supplies if needed for over the weekend.  Preformed by: Fonnie Jarvis, CMA  Additional notes/ Follow up: patient was told to follow up in 10-12 days for a voiding trial and to see Dr. Pilar Jarvis in a month as scheduled

## 2016-07-07 ENCOUNTER — Ambulatory Visit (INDEPENDENT_AMBULATORY_CARE_PROVIDER_SITE_OTHER): Payer: Medicare Other | Admitting: Family Medicine

## 2016-07-07 DIAGNOSIS — R338 Other retention of urine: Secondary | ICD-10-CM | POA: Diagnosis not present

## 2016-07-07 NOTE — Progress Notes (Signed)
Catheter Removal  Patient is present today for a catheter removal.  24ml of water was drained from the balloon. A 18FR foley cath was removed from the bladder no complications were noted . Patient tolerated well.  Preformed by: Elberta Leatherwood, CMA  Follow up/ Additional notes: Today after 3:00pm  Cath Change/ Replacement  Patient is present today for a catheter change due to urinary retention. A 18 FR foley cath was replaced into the bladder no complications were noted Urine return was noted 893ml and urine was orange in color. The balloon was filled with 62ml of sterile water. A leg bag was attached for drainage.  A night bag was also given to the patient and patient was given instruction on how to change from one bag to another. Patient was given proper instruction on catheter care.    Preformed by: Elberta Leatherwood, CMA  Follow up: 2 weeks for a Cysto

## 2016-07-19 ENCOUNTER — Other Ambulatory Visit: Payer: Medicare Other

## 2016-07-21 ENCOUNTER — Ambulatory Visit: Payer: Medicare Other

## 2016-07-22 ENCOUNTER — Other Ambulatory Visit: Payer: Medicare Other

## 2016-08-01 ENCOUNTER — Encounter: Payer: Self-pay | Admitting: Urology

## 2016-08-01 ENCOUNTER — Ambulatory Visit (INDEPENDENT_AMBULATORY_CARE_PROVIDER_SITE_OTHER): Payer: Medicare Other | Admitting: Urology

## 2016-08-01 VITALS — BP 166/86 | HR 96 | Ht 69.0 in | Wt 178.0 lb

## 2016-08-01 DIAGNOSIS — R338 Other retention of urine: Secondary | ICD-10-CM | POA: Diagnosis not present

## 2016-08-01 DIAGNOSIS — N401 Enlarged prostate with lower urinary tract symptoms: Secondary | ICD-10-CM

## 2016-08-01 DIAGNOSIS — R972 Elevated prostate specific antigen [PSA]: Secondary | ICD-10-CM | POA: Diagnosis not present

## 2016-08-01 DIAGNOSIS — N138 Other obstructive and reflux uropathy: Secondary | ICD-10-CM

## 2016-08-01 LAB — URINALYSIS, COMPLETE
Bilirubin, UA: NEGATIVE
GLUCOSE, UA: NEGATIVE
NITRITE UA: POSITIVE — AB
PH UA: 5 (ref 5.0–7.5)
Protein, UA: NEGATIVE
Specific Gravity, UA: 1.025 (ref 1.005–1.030)
UUROB: 0.2 mg/dL (ref 0.2–1.0)

## 2016-08-01 LAB — MICROSCOPIC EXAMINATION

## 2016-08-01 MED ORDER — TAMSULOSIN HCL 0.4 MG PO CAPS: 0.4000 mg | ORAL_CAPSULE | Freq: Every day | ORAL | 11 refills | Status: DC

## 2016-08-01 MED ORDER — LIDOCAINE HCL 2 % EX GEL: 1.0000 "application " | Freq: Once | CUTANEOUS | Status: DC

## 2016-08-01 MED ORDER — CIPROFLOXACIN HCL 500 MG PO TABS: 500.0000 mg | ORAL_TABLET | Freq: Once | ORAL | Status: DC

## 2016-08-01 NOTE — Progress Notes (Signed)
08/01/2016 12:02 PM   Steven Coleman 1951/06/11 [id number]  Referring provider: No referring provider defined for this encounter.  Chief Complaint  Patient presents with  . Cysto    HPI: 1) urinary retention - developed Oct 2017 - A Foley catheter was placed. 2L was drained. He failed two voiding trials. He tried CIC with straight and coude tips but developed hematuria.  2) PSA elevation - PSA was drawn at time of foley/retention and found be 20.86. His DRE was normal Oct 2017.   3) BPH - now h/o BPH before however he was started on Flomax in the ED Oct 2017.     Today, patient presents for cystoscopy and void trial. He also presents to discuss PSA and BPH management. He is well without fever. U/A from bag is dirty.    PMH: No past medical history on file.  Surgical History: Past Surgical History:  Procedure Laterality Date  . SHOULDER SURGERY Left 1982    Home Medications:    Medication List       Accurate as of 08/01/16 12:02 PM. Always use your most recent med list.          CLEAR EYES FOR DRY EYES 1-0.25 % Soln Generic drug:  Carboxymethylcellul-Glycerin Place 1-2 drops into both eyes daily as needed (for dry eyes).   PROSTATE SUPPORT PO Take 2 tablets by mouth daily.   tamsulosin 0.4 MG Caps capsule Commonly known as:  FLOMAX Take 1 capsule (0.4 mg total) by mouth daily.       Allergies: No Known Allergies  Family History: Family History  Problem Relation Age of Onset  . Prostate cancer Neg Hx   . Kidney cancer Neg Hx     Social History:  reports that he has never smoked. He has never used smokeless tobacco. He reports that he does not drink alcohol. His drug history is not on file.  ROS:                                        Physical Exam: BP (!) 166/86   Pulse 96   Ht 5\' 9"  (1.753 m)   Wt 80.7 kg (178 lb)   BMI 26.29 kg/m   Constitutional:  Alert and oriented, No acute distress. HEENT: [place] AT, moist  mucus membranes.  Trachea midline, no masses. Cardiovascular: No clubbing, cyanosis, or edema. Respiratory: Normal respiratory effort, no increased work of breathing. GI: Abdomen is soft, nontender, nondistended, no abdominal masses Skin: No rashes, bruises or suspicious lesions. Lymph: No cervical or inguinal adenopathy. Neurologic: Grossly intact, no focal deficits, moving all 4 extremities. Psychiatric: Normal mood and affect.  Laboratory Data: Lab Results  Component Value Date   WBC 7.0 06/04/2016   HGB 12.9 (L) 06/04/2016   HCT 37.2 (L) 06/04/2016   MCV 83.0 06/04/2016   PLT 147 (L) 06/04/2016    Lab Results  Component Value Date   CREATININE 1.44 (H) 06/04/2016    Lab Results  Component Value Date   PSA 20.86 (H) 06/03/2016    No results found for: TESTOSTERONE  No results found for: HGBA1C  Urinalysis    Component Value Date/Time   COLORURINE YELLOW 06/03/2016 [place] 06/03/2016 1214   LABSPEC 1.017 06/03/2016 1214   PHURINE 5.5 06/03/2016 [place] 06/03/2016 1214   HGBUR SMALL (A) 06/03/2016 1214   BILIRUBINUR  NEGATIVE 06/03/2016 [place] 06/03/2016 [place] 06/03/2016 1214   NITRITE NEGATIVE 06/03/2016 1214   [place] 06/03/2016 1214      08/01/16  CC:  Chief Complaint  Patient presents with  . Cysto    HPI:  Blood pressure (!) 166/86, pulse 96, height 5\' 9"  (1.753 m), weight 80.7 kg (178 lb). NED. A&Ox3.   No respiratory distress   Abd soft, NT, ND Normal phallus with bilateral descended testicles  Cystoscopy Procedure Note  Patient identification was confirmed, informed consent was obtained, and patient was prepped using Betadine solution.  Lidocaine jelly was administered per urethral meatus.    Preoperative abx where received prior to procedure.     Pre-Procedure: - Inspection reveals a normal caliber ureteral meatus.  Procedure: The flexible  cystoscope was introduced without difficulty - No urethral strictures/lesions are present. - Enlarged prostate - trilobar hypertrophy with median lobe - Elevated bladder neck - Trigone and ureteral orifices difficult to locate due to elevation of bladder neck.  - Bladder mucosa  reveals no ulcers, tumors, or lesions - No bladder stones - trabeculation - mild   Retroflexion shows large prostate, otherwise normal.    He was filled to 300 ml and felt an urge to void.   Post-Procedure: - Patient tolerated the procedure well  Assessment/ Plan:   Assessment & Plan:    1. Acute urinary retention - void trial unsuccessful. Discussed CIC but he's already tried it.  - Urinalysis, Complete - ciprofloxacin (CIPRO) tablet 500 mg; Take 1 tablet (500 mg total) by mouth once. - lidocaine (XYLOCAINE) 2 % jelly 1 application; Place 1 application into the urethra once.  2. PSA - recheck PSA in 3 weeks.   3. BPH - continue tamsulosin. F/u for prostate U/S to consider TURP vs HoLEP 3 weeks.  Discussed nature r/b of these procedures. He'll consider.   No Follow-up on file.  Festus Aloe, [facility] [address], [place],  [address] [phone]

## 2016-08-01 NOTE — Progress Notes (Signed)
Cath Change/ Replacement  Patient is present today for a catheter change due to urinary retention.  9 ml of water was removed from the balloon, a 16 FR foley cath was removed with out difficulty.  Patient was cleaned and prepped in a sterile fashion with betadine and 2% lidocaine jelly was instilled into the urethra. A 16 FR foley cath was replaced into the bladder no complications were noted Urine return was noted ml and urine was yellow in color. The balloon was filled with 80ml of sterile water. A leg bag was attached for drainage.  A night bag & leg bag was also given to the patient and patient was given instruction on how to change from one bag to another. Patient was given proper instruction on catheter care.    Preformed by: KR  Follow up: TRUS,  instruction was given per Sharyn Lull

## 2016-08-04 LAB — CULTURE, URINE COMPREHENSIVE

## 2016-08-05 ENCOUNTER — Telehealth: Payer: Self-pay

## 2016-08-05 NOTE — Telephone Encounter (Signed)
Steven Aloe, MD  Lestine Box, LPN        Notify patient his urine culture was positive, but that's expected with a foley. He doesn't need abx unless he's having symptoms (bladder pain, fever, etc.).    Spoke with pt wife in reference to +ucx with foley. Wife voiced understanding.

## 2016-08-12 ENCOUNTER — Telehealth: Payer: Self-pay

## 2016-08-12 NOTE — Telephone Encounter (Signed)
Pt called having concerns about +ucx, foley in place, and trying to perform CIC again. Reinforced with pt no need for abx due to not having symptoms of UTI and foley in place. Reinforced with pt due to past hx of trying CIC and the holidays coming up probably not a good idea of removing catheter. Pt agreed.  Pt then stated that he has a PSA lab drawn on 08/23/16 and f/u appt 09/07/16 with Dr. Junious Silk. Should pt still have PSA drawn with foley in place or wait? Please advise.

## 2016-08-16 NOTE — Telephone Encounter (Signed)
Yes, order a PSA to be done three days prior to follow-up - thanks.

## 2016-08-17 NOTE — Telephone Encounter (Signed)
Spoke with Steven Coleman in reference to needing PSA 3 days prior to scheduled appt on 09/07/16. Noted that scheduled appt for 1/17 is a prostate bx. Steven Coleman was very adamant that he will NOT be having a prostate bx. Can you help with this?

## 2016-08-18 NOTE — Telephone Encounter (Signed)
Spoke with patient's wife he is correct he is not having a biopsy, we just had to put the appt in a biopsy spot and he was aware of this that's why is told you he was not having a biopsy. Everything is fine now.  Sharyn Lull

## 2016-08-22 HISTORY — PX: OTHER SURGICAL HISTORY: SHX169

## 2016-08-23 ENCOUNTER — Other Ambulatory Visit: Payer: Medicare Other

## 2016-08-23 ENCOUNTER — Ambulatory Visit: Payer: Medicare Other

## 2016-08-26 ENCOUNTER — Other Ambulatory Visit: Payer: Medicare Other

## 2016-09-05 ENCOUNTER — Other Ambulatory Visit: Payer: Self-pay

## 2016-09-06 ENCOUNTER — Other Ambulatory Visit: Payer: Medicare Other

## 2016-09-06 DIAGNOSIS — R972 Elevated prostate specific antigen [PSA]: Secondary | ICD-10-CM | POA: Diagnosis not present

## 2016-09-06 DIAGNOSIS — N401 Enlarged prostate with lower urinary tract symptoms: Secondary | ICD-10-CM

## 2016-09-06 DIAGNOSIS — R338 Other retention of urine: Secondary | ICD-10-CM | POA: Diagnosis not present

## 2016-09-07 ENCOUNTER — Other Ambulatory Visit: Payer: Medicare Other

## 2016-09-07 LAB — PSA TOTAL (REFLEX TO FREE): Prostate Specific Ag, Serum: 7.8 ng/mL — ABNORMAL HIGH (ref 0.0–4.0)

## 2016-09-07 LAB — FPSA% REFLEX
% FREE PSA: 16.5 %
PSA, FREE: 1.29 ng/mL

## 2016-09-12 ENCOUNTER — Ambulatory Visit (INDEPENDENT_AMBULATORY_CARE_PROVIDER_SITE_OTHER): Payer: Medicare Other

## 2016-09-12 VITALS — BP 165/114 | HR 92 | Ht 69.0 in | Wt 172.0 lb

## 2016-09-12 DIAGNOSIS — R338 Other retention of urine: Secondary | ICD-10-CM | POA: Diagnosis not present

## 2016-09-12 NOTE — Progress Notes (Addendum)
Catheter Removal  Patient is present today for a catheter removal.  45ml of water was drained from the balloon. A 16FR coude foley cath was removed from the bladder no complications were noted . Patient tolerated well.  Preformed by: Toniann Fail, LPN   Continuous Intermittent Catheterization  Due to urinary retention patient is present today for a teaching of self I & O Catheterization. Patient was given detailed verbal and printed instructions of self catheterization. Patient was cleaned and prepped in a sterile fashion.  With instruction and assistance patient inserted a 14FR and urine return was noted 20 ml, urine was yellow in color. Patient tolerated well, no complications were noted Patient was given a sample bag with supplies to take home.  Instructions were given per Larene Beach for patient to cath two times daily.   Preformed by: Toniann Fail, LPN   Additional Notes: pt will call if CIC goes well for an order to be placed with coloplast. If develops a lot of blood again will RTC for foley to be placed.   Blood pressure (!) 165/114, pulse 92, height 5\' 9"  (1.753 m), weight 172 lb (78 kg).   Pt RTC around 2pm not being able to perform CIC. Pt stated that he developed a large amount of blood and was not able to get urine out via catheter. Cath Change/ Replacement  Patient is present today for a catheter change due to urinary retention.  78ml of water was removed from the balloon, a 16FR coude foley cath was removed with out difficulty.  Patient was cleaned and prepped in a sterile fashion with betadine and 2% lidocaine jelly was instilled into the urethra. A 16 FR coude  foley cath was replaced into the bladder no complications were noted Urine return was noted 552ml and urine was tea in color. The balloon was filled with 71ml of sterile water. A leg bag was attached for drainage.  A night bag was also given to the patient and patient was given instruction on how to change from one bag  to another. Patient was given proper instruction on catheter care.    Preformed by: Toniann Fail, LPN   Follow up: pt has a f/u appt in 2 weeks with Dr. Junious Silk.

## 2016-09-27 ENCOUNTER — Ambulatory Visit (INDEPENDENT_AMBULATORY_CARE_PROVIDER_SITE_OTHER): Payer: Medicare Other | Admitting: Urology

## 2016-09-27 ENCOUNTER — Encounter: Payer: Self-pay | Admitting: Urology

## 2016-09-27 VITALS — BP 135/89 | HR 76 | Ht 69.0 in | Wt 170.4 lb

## 2016-09-27 DIAGNOSIS — R972 Elevated prostate specific antigen [PSA]: Secondary | ICD-10-CM

## 2016-09-27 DIAGNOSIS — R339 Retention of urine, unspecified: Secondary | ICD-10-CM | POA: Diagnosis not present

## 2016-09-27 DIAGNOSIS — N400 Enlarged prostate without lower urinary tract symptoms: Secondary | ICD-10-CM | POA: Diagnosis not present

## 2016-09-27 NOTE — Progress Notes (Signed)
09/27/2016 1:32 PM   Steven Coleman 1951-05-25 [id number]  Referring provider: No referring provider defined for this encounter.  Chief Complaint  Patient presents with  . Follow-up    urinary retention     HPI: 1) urinary retention - developed Oct 2017 - A Foley catheter was placed. 2L was drained. He failed two voiding trials. He tried CIC with straight and coude tips but developed hematuria. Cystoscopy 08/01/2017 revealed trilobar hypertrophy with median lobe. Pt failed void trial / CIC again Jan 2018.    2) PSA elevation - PSA was drawn at time of foley/retention and found be 20.86. His DRE was normal Oct 2017. His PSA declined to 7.29 Aug 2016 prior to foley removal/void trial.   3) BPH - started on Flomax in the ED Oct 2017 with retention episode. No prior history.   Today, patient returns for prostate u/s and to discuss treatment of elevated PSA and urinary retention.    PMH: No past medical history on file.  Surgical History: Past Surgical History:  Procedure Laterality Date  . SHOULDER SURGERY Left 1982    Home Medications:  Allergies as of 09/27/2016   No Known Allergies     Medication List       Accurate as of 09/27/16  1:32 PM. Always use your most recent med list.          CLEAR EYES FOR DRY EYES 1-0.25 % Soln Generic drug:  Carboxymethylcellul-Glycerin Place 1-2 drops into both eyes daily as needed (for dry eyes).   PROSTATE SUPPORT PO Take 2 tablets by mouth daily.   tamsulosin 0.4 MG Caps capsule Commonly known as:  FLOMAX Take 1 capsule (0.4 mg total) by mouth daily.       Allergies: No Known Allergies  Family History: Family History  Problem Relation Age of Onset  . Prostate cancer Neg Hx   . Kidney cancer Neg Hx     Social History:  reports that he has never smoked. He has never used smokeless tobacco. He reports that he does not drink alcohol. His drug history is not on file.  ROS:                                          Physical Exam: BP 135/89   Pulse 76   Ht 5\' 9"  (1.753 m)   Wt 77.3 kg (170 lb 6.4 oz)   BMI 25.16 kg/m   Constitutional:  Alert and oriented, No acute distress. HEENT: [place] AT, moist mucus membranes.  Trachea midline, no masses. Cardiovascular: No clubbing, cyanosis, or edema. Respiratory: Normal respiratory effort, no increased work of breathing. GI: Abdomen is soft, nontender, nondistended, no abdominal masses GU: No CVA tenderness. Skin: No rashes, bruises or suspicious lesions. Lymph: No cervical or inguinal adenopathy. Neurologic: Grossly intact, no focal deficits, moving all 4 extremities. Psychiatric: Normal mood and affect.  Laboratory Data: Lab Results  Component Value Date   WBC 7.0 06/04/2016   HGB 12.9 (L) 06/04/2016   HCT 37.2 (L) 06/04/2016   MCV 83.0 06/04/2016   PLT 147 (L) 06/04/2016    Lab Results  Component Value Date   CREATININE 1.44 (H) 06/04/2016    Lab Results  Component Value Date   PSA 20.86 (H) 06/03/2016    No results found for: TESTOSTERONE  No results found for: HGBA1C  Urinalysis    Component Value  Date/Time   COLORURINE YELLOW 06/03/2016 1214   APPEARANCEUR Cloudy (A) 08/01/2016 1119   LABSPEC 1.017 06/03/2016 1214   PHURINE 5.5 06/03/2016 1214   GLUCOSEU Negative 08/01/2016 1119   HGBUR SMALL (A) 06/03/2016 1214   BILIRUBINUR Negative 08/01/2016 1119   KETONESUR NEGATIVE 06/03/2016 1214   PROTEINUR Negative 08/01/2016 1119   PROTEINUR NEGATIVE 06/03/2016 1214   NITRITE Positive (A) 08/01/2016 1119   NITRITE NEGATIVE 06/03/2016 1214   LEUKOCYTESUR 1+ (A) 08/01/2016 1119   PROSTATE U/S: prostate measured 6.03 x 4.55 x 5.97 = 85.79, no significant hypoechoic areas and prostate capsule appeared to be intact. SV's appeared normal.  DRE: prostate large, but smooth. No specific hard area or nodule.   Assessment & Plan:    1) elevated PSA - I recommended a prostate biopsy. His PSA remains elevated. We  discussed PCa and BPH treatment differs but can effect one another. We discussed prostate cancer can progress from curable to incurable and also be more difficult to treat in the future. Again, he says no biopsy. Of note, his PSA density is normal.    2) BPH - prostate was 86 grams on U/S. He asked about PAE and we discussed the nature, r/b of PAE, TURP or laser treatments. We also discussed finasteride.   3) urinary retention - continue foley. F/u 2 weeks after PAE or sooner if he needs foley change.    There are no diagnoses linked to this encounter.  No Follow-up on file.  Festus Aloe, [facility] [address] [place], [place] [address] [phone]

## 2016-10-04 ENCOUNTER — Ambulatory Visit: Payer: Medicare Other

## 2016-10-12 DIAGNOSIS — N401 Enlarged prostate with lower urinary tract symptoms: Secondary | ICD-10-CM | POA: Diagnosis not present

## 2016-10-12 DIAGNOSIS — N138 Other obstructive and reflux uropathy: Secondary | ICD-10-CM | POA: Diagnosis not present

## 2016-10-13 ENCOUNTER — Ambulatory Visit (INDEPENDENT_AMBULATORY_CARE_PROVIDER_SITE_OTHER): Payer: Medicare Other

## 2016-10-13 VITALS — BP 157/94 | HR 79 | Ht 69.0 in | Wt 167.1 lb

## 2016-10-13 DIAGNOSIS — N401 Enlarged prostate with lower urinary tract symptoms: Secondary | ICD-10-CM

## 2016-10-13 DIAGNOSIS — N138 Other obstructive and reflux uropathy: Secondary | ICD-10-CM

## 2016-10-13 NOTE — Progress Notes (Signed)
Pt presented today for foley cath change. Pt was VERY adamant to have another voiding trial. Reinforced with pt the side effects and possibilities of failing voiding trial again. Pt voiced understanding. Foley was removed. Pt will return to office this afternoon.   Simple Catheter Placement  Due to urinary retention patient is present today for a foley cath placement.  Patient was cleaned and prepped in a sterile fashion with betadine and lidocaine jelly 2% was instilled into the urethra.  A 16 FR coude foley catheter was inserted, urine return was noted  315ml, urine was yellow in color.  The balloon was filled with 10cc of sterile water.  A leg bag was attached for drainage. Patient was also given a night bag to take home and was given instruction on how to change from one bag to another.  Patient was given instruction on proper catheter care.  Patient tolerated well, no complications were noted   Preformed by: Toniann Fail, LPN   Blood pressure (!) 157/94, pulse 79, height 5\' 9"  (1.753 m), weight 167 lb 1.6 oz (75.8 kg).

## 2016-10-28 DIAGNOSIS — N401 Enlarged prostate with lower urinary tract symptoms: Secondary | ICD-10-CM | POA: Diagnosis not present

## 2016-10-28 HISTORY — PX: PROSTATE SURGERY: SHX751

## 2016-11-14 ENCOUNTER — Encounter: Payer: Self-pay | Admitting: Urology

## 2016-11-14 ENCOUNTER — Ambulatory Visit (INDEPENDENT_AMBULATORY_CARE_PROVIDER_SITE_OTHER): Payer: Medicare Other | Admitting: Urology

## 2016-11-14 VITALS — BP 164/91 | HR 76 | Ht 69.5 in | Wt 170.0 lb

## 2016-11-14 DIAGNOSIS — N401 Enlarged prostate with lower urinary tract symptoms: Secondary | ICD-10-CM | POA: Diagnosis not present

## 2016-11-14 DIAGNOSIS — N138 Other obstructive and reflux uropathy: Secondary | ICD-10-CM | POA: Diagnosis not present

## 2016-11-14 DIAGNOSIS — R8271 Bacteriuria: Secondary | ICD-10-CM

## 2016-11-14 DIAGNOSIS — R972 Elevated prostate specific antigen [PSA]: Secondary | ICD-10-CM | POA: Diagnosis not present

## 2016-11-14 MED ORDER — SULFAMETHOXAZOLE-TRIMETHOPRIM 800-160 MG PO TABS: 1.0000 | ORAL_TABLET | Freq: Two times a day (BID) | ORAL | 0 refills | Status: DC

## 2016-11-14 NOTE — Progress Notes (Signed)
Cath Change/ Replacement  Patient is present today for a catheter change due to urinary retention.  8 ml of water was removed from the balloon, a 16 FR foley cath was removed with out difficulty.The pt was instructed to go home and drink plenty of fluids and if he was unable to void to return back to the office for PVR and possible foley cath placement.   The pt returned back to the office unable to void on his own He attempted CIC, but stopped after he notice blood. He was  or CICPatient was cleaned and prepped in a sterile fashion with betadine and 2% lidocaine jelly was instilled into the urethra. A 16  FR foley cath was replaced into the bladder no complications were noted Urine return was noted 785 ml and urine was light yellow in color. The balloon was filled with 76ml of sterile water. A leg bag was attached for drainage.  A night bag was also given to the patient and patient was given instruction on how to change from one bag to another. Patient was given proper instruction on catheter care.    Preformed by: K.Argel Pablo,CMA  Follow up: appt scheduled 1-2 week w/ Larene Beach per Dr. Junious Silk.

## 2016-11-14 NOTE — Progress Notes (Signed)
11/14/2016 9:51 AM   Steven Coleman June 10, 1951 [id number]  Referring provider: No referring provider defined for this encounter.  Chief Complaint  Patient presents with  . Follow-up    acute urinary retention , post P.A.E x 2 weeks ago     HPI: 1) urinary retention - developed Oct 2017 - A Foley catheter was placed. 2L was drained. He failed two voiding trials. He tried CIC with straight and coude tips but developed hematuria. Cystoscopy 08/01/2017 revealed trilobar hypertrophy with median lobe. Pt failed void trial / CIC again Jan 2018.   2) PSA elevation - PSA was drawn at time of foley/retention and found be 20.86. His DRE was normal Oct 2017. His PSA declined to 7.29 Aug 2016 prior to foley removal/void trial. Prostate 86 grams on TRUS Feb 2018 (PSAD = 0.09). He declined a prostate biopsy.   3) BPH - started on Flomax in the ED Oct 2017 with retention episode. No prior history. Prostate 86 grams on U/S Feb 2018. He underwent PAE at [facility] Oct 28, 2016. Off tamsulosin.   Today, pt is seen for the above. I reviewed the PAE procedure note. He has some pain and pressure which went away. He voided around the catheter a couple of times. No gross hematuria. He walks a lot (4 mi per day) and saw some blood around the foley but not in urine.  He stopped tamsulosin.     PMH: No past medical history on file.  Surgical History: Past Surgical History:  Procedure Laterality Date  . SHOULDER SURGERY Left 1982    Home Medications:  Allergies as of 11/14/2016   No Known Allergies     Medication List       Accurate as of 11/14/16  9:51 AM. Always use your most recent med list.          CLEAR EYES FOR DRY EYES 1-0.25 % Soln Generic drug:  Carboxymethylcellul-Glycerin Place 1-2 drops into both eyes daily as needed (for dry eyes).   PROSTATE SUPPORT PO Take 2 tablets by mouth daily.   tamsulosin 0.4 MG Caps capsule Commonly known as:  FLOMAX Take 1 capsule (0.4 mg total) by  mouth daily.       Allergies: No Known Allergies  Family History: Family History  Problem Relation Age of Onset  . Prostate cancer Neg Hx   . Kidney cancer Neg Hx     Social History:  reports that he has never smoked. He has never used smokeless tobacco. He reports that he does not drink alcohol. His drug history is not on file.  ROS: UROLOGY Frequent Urination?: Yes Hard to postpone urination?: No Burning/pain with urination?: No Get up at night to urinate?: No Leakage of urine?: No Urine stream starts and stops?: No Trouble starting stream?: Yes Do you have to strain to urinate?: No Blood in urine?: No Urinary tract infection?: No Sexually transmitted disease?: No Injury to kidneys or bladder?: No Painful intercourse?: No Weak stream?: Yes Erection problems?: Yes Penile pain?: No  Gastrointestinal Nausea?: No Vomiting?: No Indigestion/heartburn?: No Diarrhea?: No Constipation?: No  Constitutional Fever: No Night sweats?: No Weight loss?: No Fatigue?: No  Skin Skin rash/lesions?: No Itching?: No  Eyes Blurred vision?: No Double vision?: No  Ears/Nose/Throat Sore throat?: No Sinus problems?: No  Hematologic/Lymphatic Swollen glands?: No Easy bruising?: No  Cardiovascular Leg swelling?: No Chest pain?: No  Respiratory Cough?: No Shortness of breath?: No  Endocrine Excessive thirst?: No  Musculoskeletal Back pain?: No  Joint pain?: No  Neurological Headaches?: No Dizziness?: No  Psychologic Depression?: No Anxiety?: No  Physical Exam: BP (!) 164/91   Pulse 76   Ht 5' 9.5" (1.765 m)   Wt 77.1 kg (170 lb)   BMI 24.74 kg/m   Constitutional:  Alert and oriented, No acute distress. HEENT: [place] AT, moist mucus membranes.  Trachea midline, no masses. Cardiovascular: No clubbing, cyanosis, or edema. Respiratory: Normal respiratory effort, no increased work of breathing. Lymph: No cervical or inguinal adenopathy. Neurologic: Grossly  intact, no focal deficits, moving all 4 extremities. Psychiatric: Normal mood and affect. GU: urine clear    Laboratory Data: Lab Results  Component Value Date   WBC 7.0 06/04/2016   HGB 12.9 (L) 06/04/2016   HCT 37.2 (L) 06/04/2016   MCV 83.0 06/04/2016   PLT 147 (L) 06/04/2016    Lab Results  Component Value Date   CREATININE 1.44 (H) 06/04/2016    Lab Results  Component Value Date   PSA 20.86 (H) 06/03/2016    No results found for: TESTOSTERONE  No results found for: HGBA1C  Urinalysis    Component Value Date/Time   COLORURINE YELLOW 06/03/2016 1214   APPEARANCEUR Cloudy (A) 08/01/2016 1119   LABSPEC 1.017 06/03/2016 1214   PHURINE 5.5 06/03/2016 1214   GLUCOSEU Negative 08/01/2016 1119   HGBUR SMALL (A) 06/03/2016 1214   BILIRUBINUR Negative 08/01/2016 1119   KETONESUR NEGATIVE 06/03/2016 1214   PROTEINUR Negative 08/01/2016 1119   PROTEINUR NEGATIVE 06/03/2016 1214   NITRITE Positive (A) 08/01/2016 1119   NITRITE NEGATIVE 06/03/2016 1214   LEUKOCYTESUR 1+ (A) 08/01/2016 1119     Assessment & Plan:  1) urinary retention - pt declined void trial (I recommended it). Foley was removed. He'll drink some fluids and try CIC or return if he can't void. I did cover him with Bactrim DS for the foley removal and told him to take one today and in the AM. I wrote him for a few extra if he has dysuria.   See note - pt returned in retention. Foley placed. Void trial in 2-4 weeks.   2) PSA elevation - f/u MD with PSA in 3 mo   3) BPH - on no meds. S/p PAE.   There are no diagnoses linked to this encounter.  No Follow-up on file.  Festus Aloe, [facility] [address] [place], [place] [address] [phone]

## 2016-11-29 ENCOUNTER — Ambulatory Visit: Payer: Medicare Other | Admitting: Urology

## 2016-12-10 NOTE — Progress Notes (Signed)
12/12/2016 9:02 AM   Steven Coleman February 17, 1951 [id number]  Referring provider: No referring provider defined for this encounter.  Chief Complaint  Patient presents with  . Urinary Retention    voiding trial    HPI: 66 yo WM with BPH with urinary retention, PSA elevation who has failed TOV's and could not perform CIC who underwent prostate artery embolization 10/28/2016 who presents today for a 2 week follow up.    1) urinary retention - developed Oct 2017 - A Foley catheter was placed. 2L was drained. He failed two voiding trials. He tried CIC with straight and coude tips but developed hematuria. Cystoscopy 08/01/2017 revealed trilobar hypertrophy with median lobe. Pt failed void trial / CIC again Jan 2018. s/p PAE-procedure 10/2016.    2) PSA elevation - PSA was drawn at time of foley/retention and found be 20.86. His DRE was normal Oct 2017. His PSA declined to 7.29 Aug 2016 prior to foley removal/void trial. Prostate 86 grams on TRUS Feb 2018 (PSAD = 0.09). He declined a prostate biopsy.   3) BPH - started on Flomax in the ED Oct 2017 with retention episode. No prior history. Prostate 86 grams on U/S Feb 2018. He underwent PAE at [facility] Oct 28, 2016. Off tamsulosin.   PMH: No past medical history on file.  Surgical History: Past Surgical History:  Procedure Laterality Date  . PROSTATE SURGERY     PA Procedure/ [facility]  . SHOULDER SURGERY Left 1982  . TONSILLECTOMY AND ADENOIDECTOMY      Home Medications:  Allergies as of 12/12/2016   No Known Allergies     Medication List       Accurate as of 12/12/16  9:02 AM. Always use your most recent med list.          CLEAR EYES FOR DRY EYES 1-0.25 % Soln Generic drug:  Carboxymethylcellul-Glycerin Place 1-2 drops into both eyes daily as needed (for dry eyes).   ibuprofen 800 MG tablet Commonly known as:  ADVIL,MOTRIN Take 800 mg by mouth.   methylPREDNISolone 4 MG Tbpk tablet Commonly known as:  MEDROL  DOSEPAK follow package directions   PROSTATE SUPPORT PO Take 2 tablets by mouth daily.   sulfamethoxazole-trimethoprim 800-160 MG tablet Commonly known as:  BACTRIM DS,SEPTRA DS Take 1 tablet by mouth 2 (two) times daily.   tamsulosin 0.4 MG Caps capsule Commonly known as:  FLOMAX Take 1 capsule (0.4 mg total) by mouth daily.       Allergies: No Known Allergies  Family History: Family History  Problem Relation Age of Onset  . Prostate cancer Neg Hx   . Kidney cancer Neg Hx   . Bladder Cancer Neg Hx     Social History:  reports that he has never smoked. He has never used smokeless tobacco. He reports that he drinks alcohol. His drug history is not on file.  ROS: UROLOGY Frequent Urination?: No Hard to postpone urination?: No Burning/pain with urination?: No Get up at night to urinate?: No Leakage of urine?: No Urine stream starts and stops?: No Trouble starting stream?: No Do you have to strain to urinate?: No Blood in urine?: No Urinary tract infection?: No Sexually transmitted disease?: No Injury to kidneys or bladder?: No Painful intercourse?: No Weak stream?: No Erection problems?: No Penile pain?: No  Gastrointestinal Nausea?: No Vomiting?: No Indigestion/heartburn?: No Diarrhea?: No Constipation?: No  Constitutional Fever: No Night sweats?: No Weight loss?: No Fatigue?: No  Skin Skin rash/lesions?: No Itching?:  No  Eyes Blurred vision?: No Double vision?: No  Ears/Nose/Throat Sore throat?: No Sinus problems?: No  Hematologic/Lymphatic Swollen glands?: No Easy bruising?: No  Cardiovascular Leg swelling?: No Chest pain?: No  Respiratory Cough?: No Shortness of breath?: No  Endocrine Excessive thirst?: No  Musculoskeletal Back pain?: No Joint pain?: No  Neurological Headaches?: No Dizziness?: No  Psychologic Depression?: No Anxiety?: No  Physical Exam: BP (!) 159/88   Pulse 80   Ht 5' 9.75" (1.772 m)   Wt 169 lb  4.8 oz (76.8 kg)   BMI 24.47 kg/m   Constitutional: Well nourished. Alert and oriented, No acute distress. HEENT: [place] AT, moist mucus membranes. Trachea midline, no masses. Cardiovascular: No clubbing, cyanosis, or edema. Respiratory: Normal respiratory effort, no increased work of breathing. Skin: No rashes, bruises or suspicious lesions. Lymph: No cervical or inguinal adenopathy. Neurologic: Grossly intact, no focal deficits, moving all 4 extremities. Psychiatric: Normal mood and affect.  Laboratory Data: Lab Results  Component Value Date   WBC 7.0 06/04/2016   HGB 12.9 (L) 06/04/2016   HCT 37.2 (L) 06/04/2016   MCV 83.0 06/04/2016   PLT 147 (L) 06/04/2016    Lab Results  Component Value Date   CREATININE 1.44 (H) 06/04/2016    Lab Results  Component Value Date   PSA 20.86 (H) 06/03/2016  PSA  7.8 ng/mL on 09/06/2016  Lab Results  Component Value Date   AST 14 (L) 06/03/2016   Lab Results  Component Value Date   ALT 22 06/03/2016    Assessment & Plan:    1. Urinary retention:     - foley catheter removed  - voiding trial today    - return if unable to urinate or experiencing suprapubic discomfort  - follow-up in two month for I PSS score, PVR and exam.   2. Elevated PSA   - PSA drawn at the time of Foley placement 20.86 - repeat PSA 7.8  - RTC in two months for exam; Patient is adamant about not going forward with any further PSA screenings  3. BPH with LUTS  - Continue conservative management, avoiding bladder irritants and timed voiding's  - s/p prostate artery embolization 10/2016  - RTC in two months for IPS'S, PVR and exam   Return for keep appointment with Dr. Junious Silk on 02/14/2017.  These notes generated with voice recognition software. I apologize for typographical errors.  Zara Council, [facility] [address] [place], [place] [address] [phone]

## 2016-12-12 ENCOUNTER — Ambulatory Visit (INDEPENDENT_AMBULATORY_CARE_PROVIDER_SITE_OTHER): Payer: Medicare Other | Admitting: Urology

## 2016-12-12 ENCOUNTER — Encounter: Payer: Self-pay | Admitting: Urology

## 2016-12-12 VITALS — BP 159/88 | HR 80 | Ht 69.75 in | Wt 169.3 lb

## 2016-12-12 DIAGNOSIS — R972 Elevated prostate specific antigen [PSA]: Secondary | ICD-10-CM

## 2016-12-12 DIAGNOSIS — N401 Enlarged prostate with lower urinary tract symptoms: Secondary | ICD-10-CM

## 2016-12-12 DIAGNOSIS — N138 Other obstructive and reflux uropathy: Secondary | ICD-10-CM | POA: Diagnosis not present

## 2016-12-12 DIAGNOSIS — R339 Retention of urine, unspecified: Secondary | ICD-10-CM | POA: Diagnosis not present

## 2016-12-12 NOTE — Progress Notes (Signed)
Catheter Removal  Patient is present today for a catheter removal.  9ml of water was drained from the balloon. A 16FR foley cath was removed from the bladder no complications were noted . Patient tolerated well.  Preformed by: Tangi Shroff CMA  

## 2016-12-14 ENCOUNTER — Encounter: Payer: Self-pay | Admitting: Emergency Medicine

## 2016-12-14 ENCOUNTER — Emergency Department
Admission: EM | Admit: 2016-12-14 | Discharge: 2016-12-14 | Disposition: A | Discharge status: Home / Self Care | Payer: Medicare Other | Attending: Emergency Medicine | Admitting: Emergency Medicine

## 2016-12-14 ENCOUNTER — Telehealth: Payer: Self-pay

## 2016-12-14 DIAGNOSIS — Z79899 Other long term (current) drug therapy: Secondary | ICD-10-CM | POA: Insufficient documentation

## 2016-12-14 DIAGNOSIS — R339 Retention of urine, unspecified: Secondary | ICD-10-CM | POA: Diagnosis not present

## 2016-12-14 DIAGNOSIS — Z791 Long term (current) use of non-steroidal anti-inflammatories (NSAID): Secondary | ICD-10-CM | POA: Diagnosis not present

## 2016-12-14 HISTORY — DX: Benign prostatic hyperplasia without lower urinary tract symptoms: N40.0

## 2016-12-14 NOTE — ED Notes (Signed)
Bladder scan 572mls     Pt cath with 16 fr foley catheter without any urine return in tubing. Small amount of bloody urine around meatus.  Pt tolerated cath procedure well.  md aware no urine in cath/tubing.

## 2016-12-14 NOTE — Telephone Encounter (Signed)
Received notice from after hours triage line of pt not being able to urinate. Spoke with pt who stated he was seen in the ER last night and a foley was placed. Pt stated that Monday was able to urinate on his own, Tuesday he had to perform CIC in the morning with no problem, and then in the afternoon he did CIC with some resistance. Then last night he was not able to cath at all due to blood and pain. Per ER note 500cc was drained from bladder. Offered pt an appt in 1 weeks to see [facility]. Pt stated that since he had the P&E he would prefer to wait another 30 days before removing foley. Pt was transferred to the front to make f/u appt.

## 2016-12-14 NOTE — ED Triage Notes (Signed)
Pt had foley removed yesterday after having it in place for 6 months due to prostate problems. Self cathed at 2 pm but has not voided since then.

## 2016-12-14 NOTE — ED Notes (Signed)
Pt states that he had a foley taken out yesterday (had it in for 6 months). Was voiding ok yesterday....had to self cath this morning. He tried tonight but he states that it's very irritated and probably swollen. He is here for a cath.

## 2016-12-14 NOTE — ED Notes (Signed)
approx 550 ccbloody urine in foley bag.  Leg placed on left leg.  Pt  Tolerated well and drank 325cc water.

## 2016-12-14 NOTE — Discharge Instructions (Signed)
We did not check any urine or blood work today. Please follow-up with your urologist but return here with any worsening symptoms or concerns.

## 2016-12-14 NOTE — ED Provider Notes (Signed)
[facility] Emergency Department Provider Note   ____________________________________________   First MD Initiated Contact with Patient 12/14/16 [phone]     (approximate)  I have reviewed the triage vital signs and the nursing notes.   HISTORY  Chief Complaint Urinary Retention    HPI Steven Coleman is a 66 y.o. male who comes into the hospital today with prostate issues. The patient reports that he needs a Foley catheter. The patient reports that he had a catheter for 6 months. About 6 weeks ago he had prostatic artery embolization procedure at [facility]. He reports that 2 days ago he had his catheter removed at [facility]ic. He was doing fine the first day but reports that yesterday he did have to self catheter multiple times. He reports that he cathetered himself in the morning and was dribbling most of the day. The patient also cathetered himself in the evening but when he woke up this morninghe reports that he was unable to pass the catheter. He reports that he tried multiple times but would only get blood. He reports that he has the urge to urinate and abdominal pain. He is here today for a catheter to be placed. He denies any fevers, nausea, vomiting, chest pain or shortness of breath.   Past Medical History:  Diagnosis Date  . BPH (benign prostatic hyperplasia)     Patient Active Problem List   Diagnosis Date Noted  . Acute urinary retention 06/04/2016  . Acute renal failure (ARF) ([place]) 06/03/2016  . Elevated PSA 06/03/2016    Past Surgical History:  Procedure Laterality Date  . PROSTATE SURGERY     PA Procedure/ [facility]  . prostatic artery embolization    . SHOULDER SURGERY Left 1982  . TONSILLECTOMY AND ADENOIDECTOMY      Prior to Admission medications   Medication Sig Start Date End Date Taking? Authorizing Provider  Carboxymethylcellul-Glycerin (CLEAR EYES FOR DRY EYES) 1-0.25 % SOLN Place 1-2 drops into both eyes daily  as needed (for dry eyes).    Historical Provider, MD  ibuprofen (ADVIL,MOTRIN) 800 MG tablet Take 800 mg by mouth. 10/28/16   Historical Provider, MD  methylPREDNISolone (MEDROL DOSEPAK) 4 MG TBPK tablet follow package directions 10/28/16   Historical Provider, MD  Misc Natural Products (PROSTATE SUPPORT PO) Take 2 tablets by mouth daily.    Historical Provider, MD  sulfamethoxazole-trimethoprim (BACTRIM DS,SEPTRA DS) 800-160 MG tablet Take 1 tablet by mouth 2 (two) times daily. Patient not taking: Reported on 12/12/2016 11/14/16   Festus Aloe, MD  tamsulosin (FLOMAX) 0.4 MG CAPS capsule Take 1 capsule (0.4 mg total) by mouth daily. Patient not taking: Reported on 12/12/2016 08/01/16   Festus Aloe, MD    Allergies Patient has no known allergies.  Family History  Problem Relation Age of Onset  . Prostate cancer Neg Hx   . Kidney cancer Neg Hx   . Bladder Cancer Neg Hx     Social History Social History  Substance Use Topics  . Smoking status: Never Smoker  . Smokeless tobacco: Never Used  . Alcohol use Yes     Comment: wine    Review of Systems  Constitutional: No fever/chills Eyes: No visual changes. ENT: No sore throat. Cardiovascular: Denies chest pain. Respiratory: Denies shortness of breath. Gastrointestinal:  abdominal pain.  No nausea, no vomiting.  No diarrhea.  No constipation. Genitourinary: urinary retention Musculoskeletal: Negative for back pain. Skin: Negative for rash. Neurological: Negative for headaches, focal weakness or numbness.  ____________________________________________   PHYSICAL EXAM:  VITAL SIGNS: ED Triage Vitals  Enc Vitals Group     BP 12/14/16 0119 (!) 200/99     Pulse Rate 12/14/16 0119 (!) 124     Resp 12/14/16 0119 18     Temp 12/14/16 0119 98.2 F (36.8 C)     Temp Source 12/14/16 0119 Oral     SpO2 12/14/16 0119 100 %     Weight 12/14/16 0118 165 lb (74.8 kg)     Height 12/14/16 0118 5\' 9"  (1.753 m)     Head  Circumference --      Peak Flow --      Pain Score 12/14/16 0117 9     Pain Loc --      Pain Edu? --      Excl. in [place]? --     Constitutional: Alert and oriented. Well appearing and in moderate distress. Eyes: Conjunctivae are normal. PERRL. EOMI. Head: Atraumatic. Nose: No congestion/rhinnorhea. Mouth/Throat: Mucous membranes are moist.  Oropharynx non-erythematous. Cardiovascular: Normal rate, regular rhythm. Grossly normal heart sounds.  Good peripheral circulation. Respiratory: Normal respiratory effort.  No retractions. Lungs CTAB. Gastrointestinal: Soft with some severe suprapubic abdominal pain. No distention. Positive bowel sounds. Genitourinary: Normal external genitalia with some blood at the meatus Musculoskeletal: No lower extremity tenderness nor edema.  Neurologic:  Normal speech and language.  Skin:  Skin is warm, dry and intact.  Psychiatric: Mood and affect are normal.  ____________________________________________   LABS (all labs ordered are listed, but only abnormal results are displayed)  Labs Reviewed - No data to display ____________________________________________  EKG  none ____________________________________________  RADIOLOGY  none ____________________________________________   PROCEDURES  Procedure(s) performed: None  Procedures  Critical Care performed: No  ____________________________________________   INITIAL IMPRESSION / ASSESSMENT AND PLAN / ED COURSE  Pertinent labs & imaging results that were available during my care of the patient were reviewed by me and considered in my medical decision making (see chart for details).  This is a 66 year old male who comes into the hospital today with acute urinary retention. We initially attempted to pass a catheter that had to use a rigid-tipped catheter. Once the Foley was in place cloudy appearing red tinged urine drained out of the patient's bladder. I did tell the patient that I would  check his urinalysis as well as his creatinine to ensure that he wasn't in any renal failure. The patient declined any exams at this time. He reports that he's been dealing with this for 6 months and does not expect any creatinine changes from less than 24 hours of urinary retention. The patient also reports that his urine has been cloudy because of his long-term Foley catheter use and that even though he has bacteria in his urine the urologist is following with it. He reports that he does not want any other testing but he will follow up with his urologist Dr. Junious Silk. I did send an email to the patient's urologist to ensure that he will follow-up. The patient's wife reports that this has been a long process and they know he will continue to follow up. He already has an appointment scheduled for April 26. The patient will be discharged to home. His heart rate and blood pressure were improved after his bladder was drained. He did have over 500 ML's of urine on the bladder scan.      ____________________________________________   FINAL CLINICAL IMPRESSION(S) / ED DIAGNOSES  Final diagnoses:  Urinary retention  NEW MEDICATIONS STARTED DURING THIS VISIT:  Discharge Medication List as of 12/14/2016  2:37 AM       Note:  This document was prepared using Dragon voice recognition software and may include unintentional dictation errors.    Loney Hering, MD 12/14/16 [phone]

## 2017-01-09 ENCOUNTER — Ambulatory Visit: Payer: Medicare Other

## 2017-01-17 NOTE — Progress Notes (Addendum)
01/18/2017 9:17 AM   Steven Coleman November 10, 1950 [id number]  Referring provider: No referring provider defined for this encounter.  Chief Complaint  Patient presents with  . Follow-up    from ER urinary retention    HPI: 66 yo WM with BPH with urinary retention, PSA elevation who has failed TOV's and could not perform CIC who underwent prostate artery embolization 10/28/2016 who presents today for a 1 month follow up.    1) urinary retention - developed Oct 2017 - A Foley catheter was placed. 2L was drained. He failed two voiding trials. He tried CIC with straight and coude tips but developed hematuria. Cystoscopy 08/01/2017 revealed trilobar hypertrophy with median lobe. Pt failed void trial / CIC again Jan 2018. s/p PAE-procedure 10/2016  - seen in ED one month ago after he failed CIC - he has a catheter in place at this time and would like to have another TOV  2) PSA elevation - PSA was drawn at time of foley/retention and found be 20.86. His DRE was normal Oct 2017. His PSA declined to 7.29 Aug 2016 prior to foley removal/void trial. Prostate 86 grams on TRUS Feb 2018 (PSAD = 0.09). He declined a prostate biopsy.   3) BPH - started on Flomax in the ED Oct 2017 with retention episode. No prior history. Prostate 86 grams on U/S Feb 2018. He underwent PAE at [facility] Oct 28, 2016. Off tamsulosin.   PMH: Past Medical History:  Diagnosis Date  . BPH (benign prostatic hyperplasia)     Surgical History: Past Surgical History:  Procedure Laterality Date  . prostatic artery embolization    . SHOULDER SURGERY Left 1982  . TONSILLECTOMY AND ADENOIDECTOMY      Home Medications:  Allergies as of 01/18/2017   No Known Allergies     Medication List       Accurate as of 01/18/17  9:17 AM. Always use your most recent med list.          CLEAR EYES FOR DRY EYES 1-0.25 % Soln Generic drug:  Carboxymethylcellul-Glycerin Place 1-2 drops into both eyes daily as needed (for dry  eyes).   ibuprofen 800 MG tablet Commonly known as:  ADVIL,MOTRIN Take 800 mg by mouth.   methylPREDNISolone 4 MG Tbpk tablet Commonly known as:  MEDROL DOSEPAK follow package directions   PROSTATE SUPPORT PO Take 2 tablets by mouth daily.   sulfamethoxazole-trimethoprim 800-160 MG tablet Commonly known as:  BACTRIM DS,SEPTRA DS Take 1 tablet by mouth 2 (two) times daily.   tamsulosin 0.4 MG Caps capsule Commonly known as:  FLOMAX Take 1 capsule (0.4 mg total) by mouth daily.       Allergies: No Known Allergies  Family History: Family History  Problem Relation Age of Onset  . Prostate cancer Neg Hx   . Kidney cancer Neg Hx   . Bladder Cancer Neg Hx     Social History:  reports that he has never smoked. He has never used smokeless tobacco. He reports that he drinks alcohol. He reports that he does not use drugs.  ROS: UROLOGY Frequent Urination?: No Hard to postpone urination?: No Burning/pain with urination?: No Get up at night to urinate?: No Leakage of urine?: No Urine stream starts and stops?: No Trouble starting stream?: No Do you have to strain to urinate?: No Blood in urine?: No Urinary tract infection?: No Sexually transmitted disease?: No Injury to kidneys or bladder?: No Painful intercourse?: No Weak stream?: No Erection problems?: No  Penile pain?: No  Gastrointestinal Nausea?: No Vomiting?: No Indigestion/heartburn?: No Diarrhea?: No Constipation?: No  Constitutional Fever: No Night sweats?: No Weight loss?: No Fatigue?: No  Skin Skin rash/lesions?: No Itching?: No  Eyes Blurred vision?: No Double vision?: No  Ears/Nose/Throat Sore throat?: No Sinus problems?: No  Hematologic/Lymphatic Swollen glands?: No Easy bruising?: No  Cardiovascular Leg swelling?: No Chest pain?: No  Respiratory Cough?: No Shortness of breath?: No  Endocrine Excessive thirst?: No  Musculoskeletal Back pain?: No Joint pain?:  No  Neurological Headaches?: No Dizziness?: No  Psychologic Depression?: No Anxiety?: No  Physical Exam: BP (!) 151/85   Pulse 76   Ht 5' 9.5" (1.765 m)   Wt 164 lb 4.8 oz (74.5 kg)   BMI 23.92 kg/m   Constitutional: Well nourished. Alert and oriented, No acute distress. HEENT: [place] AT, moist mucus membranes. Trachea midline, no masses. Cardiovascular: No clubbing, cyanosis, or edema. Respiratory: Normal respiratory effort, no increased work of breathing. Skin: No rashes, bruises or suspicious lesions. Lymph: No cervical or inguinal adenopathy. Neurologic: Grossly intact, no focal deficits, moving all 4 extremities. Psychiatric: Normal mood and affect.  Laboratory Data: Lab Results  Component Value Date   WBC 7.0 06/04/2016   HGB 12.9 (L) 06/04/2016   HCT 37.2 (L) 06/04/2016   MCV 83.0 06/04/2016   PLT 147 (L) 06/04/2016    Lab Results  Component Value Date   CREATININE 1.44 (H) 06/04/2016    Lab Results  Component Value Date   PSA 20.86 (H) 06/03/2016  PSA  7.8 ng/mL on 09/06/2016  Lab Results  Component Value Date   AST 14 (L) 06/03/2016   Lab Results  Component Value Date   ALT 22 06/03/2016    Assessment & Plan:    1. Urinary retention:     - foley catheter removed  - voiding trial today    - return if unable to urinate or experiencing suprapubic discomfort  - Patient caths 3 times daily, indefinitely due to urinary retention.  Patient requires coude caths due to inability to pass a straight cath  - keep follow up with Dr. Junious Silk   2. Elevated PSA   - PSA drawn at the time of Foley placement 20.86 - repeat PSA 7.8  - Patient is adamant about not going forward with any further PSA screenings  - keep follow up with Dr. Junious Silk  3. BPH with LUTS  - s/p prostate artery embolization 10/2016 - patient has not had contact with the radiologist performed the procedure - I advised him to contact Dr. Wilhelmina Mcardle to give him an update on his progress as I  not familiar with the PAE procedure to make sure things are going as expected - also discussed with the patient if he was wanting to pursue any other treatments at this time - he declined  - keep follow up with Dr. Junious Silk  Return for keep follow up with Dr. Junious Silk.  These notes generated with voice recognition software. I apologize for typographical errors.  Zara Council, [facility] [address], [place],  [address] [phone]

## 2017-01-18 ENCOUNTER — Encounter: Payer: Self-pay | Admitting: Urology

## 2017-01-18 ENCOUNTER — Ambulatory Visit (INDEPENDENT_AMBULATORY_CARE_PROVIDER_SITE_OTHER): Payer: Medicare Other | Admitting: Urology

## 2017-01-18 VITALS — BP 151/85 | HR 76 | Ht 69.5 in | Wt 164.3 lb

## 2017-01-18 DIAGNOSIS — N401 Enlarged prostate with lower urinary tract symptoms: Secondary | ICD-10-CM

## 2017-01-18 DIAGNOSIS — R339 Retention of urine, unspecified: Secondary | ICD-10-CM

## 2017-01-18 DIAGNOSIS — R972 Elevated prostate specific antigen [PSA]: Secondary | ICD-10-CM

## 2017-01-18 DIAGNOSIS — N138 Other obstructive and reflux uropathy: Secondary | ICD-10-CM

## 2017-01-18 NOTE — Progress Notes (Signed)
Catheter Removal  Patient is present today for a catheter removal.  39ml of water was drained from the balloon. A 16FR Coude foley cath was removed from the bladder no complications were noted . Patient tolerated well.  Preformed by: Lyndee Hensen CMA  Follow up/ Additional notes: Come back to office by 3:00pm if can not void on own.

## 2017-01-21 ENCOUNTER — Emergency Department
Admission: EM | Admit: 2017-01-21 | Discharge: 2017-01-21 | Disposition: A | Discharge status: Home / Self Care | Payer: Medicare Other | Attending: Emergency Medicine | Admitting: Emergency Medicine

## 2017-01-21 ENCOUNTER — Encounter: Payer: Self-pay | Admitting: Emergency Medicine

## 2017-01-21 DIAGNOSIS — R339 Retention of urine, unspecified: Secondary | ICD-10-CM | POA: Diagnosis not present

## 2017-01-21 DIAGNOSIS — Z79899 Other long term (current) drug therapy: Secondary | ICD-10-CM | POA: Insufficient documentation

## 2017-01-21 LAB — URINALYSIS, COMPLETE (UACMP) WITH MICROSCOPIC
Bacteria, UA: NONE SEEN
Bilirubin Urine: NEGATIVE
GLUCOSE, UA: NEGATIVE mg/dL
Ketones, ur: 5 mg/dL — AB
Nitrite: NEGATIVE
PH: 5 (ref 5.0–8.0)
Protein, ur: NEGATIVE mg/dL
SPECIFIC GRAVITY, URINE: 1.025 (ref 1.005–1.030)

## 2017-01-21 NOTE — ED Triage Notes (Signed)
Pt to ed with c/o long history of prostate problems.  Pt reports this am he was unable to cath himself due to blockage.  Pt states last urine output was yesterday.

## 2017-01-21 NOTE — Discharge Instructions (Signed)
Please keep your appointment on Wednesday as scheduled and return to the emergency department for any concerns.  It was a pleasure to take care of you today, and thank you for coming to our emergency department.  If you have any questions or concerns before leaving please ask the nurse to grab me and I'm more than happy to go through your aftercare instructions again.  If you were prescribed any opioid pain medication today such as Norco, Vicodin, Percocet, morphine, hydrocodone, or oxycodone please make sure you do not drive when you are taking this medication as it can alter your ability to drive safely.  If you have any concerns once you are home that you are not improving or are in fact getting worse before you can make it to your follow-up appointment, please do not hesitate to call 911 and come back for further evaluation.  Darel Hong MD

## 2017-01-21 NOTE — ED Notes (Signed)

## 2017-01-21 NOTE — ED Provider Notes (Signed)
[facility] Emergency Department Provider Note  ____________________________________________   First MD Initiated Contact with Patient 01/21/17 1058     (approximate)  I have reviewed the triage vital signs and the nursing notes.   HISTORY  Chief Complaint Urinary Retention    HPI Steven Coleman is a 66 y.o. male who self presents to the emergency Department with roughly 12 hours of acute urinary retention. He is a past medical history of a 9 prostatic hypertrophy and 3 months ago had an embolization of his prostate done at [facility]. He periodically has a Foley catheter in place and every month he has a trial of void. His catheter was most recently removed on Wednesday and he was initially able to urinate for one day but then he became obstructed. For a day or 2 he was able to self catheter but beginning last night he was unable to pass the catheter. He denies fevers or chills. He reports moderate severity aching suprapubic pain. It is gradually progressive with time and nothing in particular makes it better.   Past Medical History:  Diagnosis Date  . BPH (benign prostatic hyperplasia)     Patient Active Problem List   Diagnosis Date Noted  . Acute urinary retention 06/04/2016  . Acute renal failure (ARF) ([place]) 06/03/2016  . Elevated PSA 06/03/2016    Past Surgical History:  Procedure Laterality Date  . prostatic artery embolization    . SHOULDER SURGERY Left 1982  . TONSILLECTOMY AND ADENOIDECTOMY      Prior to Admission medications   Medication Sig Start Date End Date Taking? Authorizing Provider  Carboxymethylcellul-Glycerin (CLEAR EYES FOR DRY EYES) 1-0.25 % SOLN Place 1-2 drops into both eyes daily as needed (for dry eyes).    [provider]  ibuprofen (ADVIL,MOTRIN) 800 MG tablet Take 800 mg by mouth. 10/28/16   [provider]  methylPREDNISolone (MEDROL DOSEPAK) 4 MG TBPK tablet follow package directions 10/28/16   [provider]  Misc Natural Products (PROSTATE SUPPORT PO) Take 2 tablets by mouth daily.    [provider]  sulfamethoxazole-trimethoprim (BACTRIM DS,SEPTRA DS) 800-160 MG tablet Take 1 tablet by mouth 2 (two) times daily. Patient not taking: Reported on 12/12/2016 11/14/16   Festus Aloe, MD  tamsulosin (FLOMAX) 0.4 MG CAPS capsule Take 1 capsule (0.4 mg total) by mouth daily. Patient not taking: Reported on 12/12/2016 08/01/16   Festus Aloe, MD    Allergies Patient has no known allergies.  Family History  Problem Relation Age of Onset  . Prostate cancer Neg Hx   . Kidney cancer Neg Hx   . Bladder Cancer Neg Hx     Social History Social History  Substance Use Topics  . Smoking status: Never Smoker  . Smokeless tobacco: Never Used  . Alcohol use Yes     Comment: wine    Review of Systems Constitutional: No fever/chills Eyes: No visual changes. ENT: No sore throat. Cardiovascular: Denies chest pain. Respiratory: Denies shortness of breath. Gastrointestinal: Positive abdominal pain.  No nausea, no vomiting.  No diarrhea.  No constipation. Genitourinary: Negative for dysuria. Musculoskeletal: Negative for back pain. Skin: Negative for rash. Neurological: Negative for headaches, focal weakness or numbness.   ____________________________________________   PHYSICAL EXAM:  VITAL SIGNS: ED Triage Vitals [01/21/17 1029]  Enc Vitals Group     BP (!) 162/99     Pulse Rate (!) 115     Resp 20     Temp 98.2 F (36.8  C)     Temp Source Oral     SpO2 100 %     Weight 164 lb (74.4 kg)     Height      Head Circumference      Peak Flow      Pain Score 7     Pain Loc      Pain Edu?      Excl. in [place]?     Constitutional: Alert and oriented x 4 well appearing nontoxic no diaphoresis speaks in full, clear sentences Eyes: PERRL EOMI. Head: Atraumatic. Nose: No congestion/rhinnorhea. Mouth/Throat: No trismus Neck: No stridor.   Cardiovascular: Normal  rate, regular rhythm. Grossly normal heart sounds.  Good peripheral circulation. Respiratory: Normal respiratory effort.  No retractions. Lungs CTAB and moving good air Gastrointestinal: Soft nondistended somewhat tender in the suprapubic region Musculoskeletal: No lower extremity edema   Neurologic:  Normal speech and language. No gross focal neurologic deficits are appreciated. Skin:  Skin is warm, dry and intact. No rash noted. Psychiatric: Mood and affect are normal. Speech and behavior are normal.    ____________________________________________   DIFFERENTIAL  Acute urinary retention, BPH, urinary tract infection ____________________________________________   LABS (all labs ordered are listed, but only abnormal results are displayed)  Labs Reviewed  URINE CULTURE  URINALYSIS, COMPLETE (UACMP) WITH MICROSCOPIC     __________________________________________  EKG   ____________________________________________  RADIOLOGY   ____________________________________________   PROCEDURES  Procedure(s) performed: no  Procedures  Critical Care performed: no  Observation: no ____________________________________________   INITIAL IMPRESSION / ASSESSMENT AND PLAN / ED COURSE  Pertinent labs & imaging results that were available during my care of the patient were reviewed by me and considered in my medical decision making (see chart for details).  Nursing was able to pass a coud Foley catheter without complication. I recommended to the patient that he have his renal function checked however he declined stating he knows his creatinine is okay and he would like to just go home. He has follow-up this coming Wednesday. He has no infectious symptoms. At this point he is discharged home in improved condition.      ____________________________________________   FINAL CLINICAL IMPRESSION(S) / ED DIAGNOSES  Final diagnoses:  Urinary retention      NEW MEDICATIONS  STARTED DURING THIS VISIT:  New Prescriptions   No medications on file     Note:  This document was prepared using Dragon voice recognition software and may include unintentional dictation errors.     Darel Hong, MD 01/21/17 1151

## 2017-01-22 LAB — URINE CULTURE

## 2017-01-25 ENCOUNTER — Ambulatory Visit (INDEPENDENT_AMBULATORY_CARE_PROVIDER_SITE_OTHER): Payer: Medicare Other | Admitting: Adult Health

## 2017-01-25 ENCOUNTER — Encounter: Payer: Self-pay | Admitting: Adult Health

## 2017-01-25 VITALS — BP 144/78 | HR 69 | Ht 69.0 in | Wt 161.0 lb

## 2017-01-25 DIAGNOSIS — N179 Acute kidney failure, unspecified: Secondary | ICD-10-CM | POA: Diagnosis not present

## 2017-01-25 DIAGNOSIS — Z0000 Encounter for general adult medical examination without abnormal findings: Secondary | ICD-10-CM | POA: Diagnosis not present

## 2017-01-25 DIAGNOSIS — R972 Elevated prostate specific antigen [PSA]: Secondary | ICD-10-CM

## 2017-01-25 DIAGNOSIS — Z13220 Encounter for screening for lipoid disorders: Secondary | ICD-10-CM

## 2017-01-25 DIAGNOSIS — R7309 Other abnormal glucose: Secondary | ICD-10-CM | POA: Diagnosis not present

## 2017-01-25 DIAGNOSIS — R5383 Other fatigue: Secondary | ICD-10-CM

## 2017-01-25 NOTE — Progress Notes (Signed)
Subjective:    Patient ID: Steven Coleman, male    DOB: [birth year], 66 y.o.   MRN: [id number]  HPI:  Mr. Is here to establish as a new pt.  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  He leads a very healthy life-eats well balanced diet (organic food grown on his family farm) and gets regular exercise daily.  He has not had contact with PCP > years and has enjoyed uncomplicated health until Oct 2017- he experienced anuria for > 7 days and was admitted/treated for AKF.  He was dx'd with BPH with intermittent obstruction.  He is followed by [facility] and recently had PAE with specialist at [facility] in March 2018.  He has had indwelling cath for > 7 months and has follow up with Urology team in the next 2 weeks.  He denies daily Rx medications and states "I don't need them and I wouldn't take them even if I did".  He is married and has 3 grown children (2 of which live on the family farm).  He denies current EOTH/tobacco use.   Patient Care Team    Relationship Specialty Notifications Start End  Esaw Grandchild, NP PCP - General Family Medicine  01/25/17     Patient Active Problem List   Diagnosis Date Noted  . Health care maintenance 01/25/2017  . Acute urinary retention 06/04/2016  . Elevated PSA 06/03/2016     Past Medical History:  Diagnosis Date  . BPH (benign prostatic hyperplasia)      Past Surgical History:  Procedure Laterality Date  . EYE SURGERY    . 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 prostatic artery embolization    . SHOULDER SURGERY Left 1982  . TONSILLECTOMY AND ADENOIDECTOMY       Family History  Problem Relation Age of Onset  . Cancer Mother        lung  . Cancer Father        throat  . Prostate cancer Neg Hx   . Kidney cancer Neg Hx   . Bladder Cancer Neg Hx      History  Drug Use No     History  Alcohol Use No    Comment: wine     History  Smoking Status  . Never Smoker  Smokeless Tobacco  . Never Used      Outpatient Encounter Prescriptions as of 01/25/2017  Medication Sig  . [DISCONTINUED] Carboxymethylcellul-Glycerin (CLEAR EYES FOR DRY EYES) 1-0.25 % SOLN Place 1-2 drops into both eyes daily as needed (for dry eyes).  . [DISCONTINUED] ibuprofen (ADVIL,MOTRIN) 800 MG tablet Take 800 mg by mouth.  . [DISCONTINUED] methylPREDNISolone (MEDROL DOSEPAK) 4 MG TBPK tablet follow package directions  . [DISCONTINUED] Misc Natural Products (PROSTATE SUPPORT PO) Take 2 tablets by mouth daily.  . [DISCONTINUED] sulfamethoxazole-trimethoprim (BACTRIM DS,SEPTRA DS) 800-160 MG tablet Take 1 tablet by mouth 2 (two) times daily. (Patient not taking: Reported on 12/12/2016)  . [DISCONTINUED] tamsulosin (FLOMAX) 0.4 MG CAPS capsule Take 1 capsule (0.4 mg total) by mouth daily. (Patient not taking: Reported on 12/12/2016)   Facility-Administered Encounter Medications as of 01/25/2017  Medication  . ciprofloxacin (CIPRO) tablet 500 mg  . lidocaine (XYLOCAINE) 2 % jelly 1 application    Allergies: Patient has no known allergies.  Body mass index is 23.78 kg/m.  Blood pressure (!) 144/78, pulse 69, height 5\' 9"  (1.753 m), weight 161 lb (73 kg).  Review of Systems  Constitutional: Positive for fatigue. Negative for activity change, appetite change, chills, diaphoresis, fever and unexpected weight change.  Eyes: Negative for visual disturbance.  Respiratory: Negative for cough, chest tightness, shortness of breath, wheezing and stridor.   Cardiovascular: Negative for chest pain, palpitations and leg swelling.  Gastrointestinal: Negative for abdominal distention, abdominal pain, blood in stool, constipation, diarrhea, nausea and vomiting.  Endocrine: Negative for cold intolerance, heat intolerance, polydipsia, polyphagia and polyuria.  Genitourinary: Positive for difficulty urinating and hematuria. Negative for decreased urine volume, dysuria, flank pain, testicular pain and urgency.  Musculoskeletal:  Negative for arthralgias, back pain, gait problem, joint swelling, myalgias, neck pain and neck stiffness.  Skin: Negative for color change, pallor, rash and wound.  Allergic/Immunologic: Negative for immunocompromised state.  Neurological: Negative for headaches.  Hematological: Does not bruise/bleed easily.  Psychiatric/Behavioral: Negative for dysphoric mood and sleep disturbance. The patient is not nervous/anxious.        Objective:   Physical Exam  Constitutional: He is oriented to person, place, and time. He appears well-developed and well-nourished. No distress.  HENT:  Head: Normocephalic and atraumatic.  Right Ear: External ear normal.  Left Ear: External ear normal.  Eyes: Conjunctivae are normal. Pupils are equal, round, and reactive to light.  Neck: Normal range of motion. Neck supple.  Cardiovascular: Normal rate, normal heart sounds and intact distal pulses.   No murmur heard. Pulmonary/Chest: Effort normal and breath sounds normal. No respiratory distress. He has no wheezes. He has no rales. He exhibits no tenderness.  Abdominal: Soft. Bowel sounds are normal. He exhibits no distension and no mass. There is no tenderness. There is no rebound, no guarding and no CVA tenderness.  Genitourinary:  Genitourinary Comments: Intact indwelling cath secured to RLE.  Musculoskeletal: Normal range of motion.  Lymphadenopathy:    He has no cervical adenopathy.  Neurological: He is alert and oriented to person, place, and time.  Skin: Skin is warm and dry. No rash noted. He is not diaphoretic. No erythema. No pallor.  Psychiatric: He has a normal mood and affect. His behavior is normal. Judgment and thought content normal.  Nursing note and vitals reviewed.         Assessment & Plan:   1. Elevated PSA   2. Acute renal failure, unspecified acute renal failure type ([place])   3. Fatigue, unspecified type   4. Screening for hyperlipidemia   5. Health care maintenance      Health care maintenance Full set of fasting labs to include PSA drawn today. He lives a very healthy lifestyle and is very opposed to traditional medicine. Recommend annual follow-up. Continue follow-up with various Urology specialists-has appt's this month.  Elevated PSA BPH dx'd Oct 2017 PAE 10/28/2016 Followed by [facility] and [facility] specialist (who performed PAE).    FOLLOW-UP:  Return in about 1 year (around 01/25/2018) for Regular Follow Up.

## 2017-01-25 NOTE — Assessment & Plan Note (Signed)
BPH dx'd Oct 2017 PAE 10/28/2016 Followed by [facility] and [facility] specialist (who performed PAE).

## 2017-01-25 NOTE — Assessment & Plan Note (Signed)
Full set of fasting labs to include PSA drawn today. He lives a very healthy lifestyle and is very opposed to traditional medicine. Recommend annual follow-up. Continue follow-up with various Urology specialists-has appt's this month.

## 2017-01-25 NOTE — Patient Instructions (Signed)
Benign Prostatic Hyperplasia °Benign prostatic hyperplasia is when the prostate gland is bigger than normal (enlarged). The prostate is a gland that produces the fluid that goes into semen. It is near the opening to the bladder and it surrounds the tube that drains urine out of the body (urethra). Benign prostatic hyperplasia is common among older men and it typically causes problems with urinating. °The prostate grows slowly as you age. As the prostate grows, it can pinch the urethra. This causes the bladder to work too hard to pass urine, which leads to a thickened bladder wall. The bladder may eventually become weak and unable to empty completely. °What are the causes? °The exact cause of this condition is not known. It may be related to changes in hormones as the body ages. °What increases the risk? °You are more likely to develop this condition if: °· You have a family history of the condition. °· You are age 40 or older. °· You have a history of erectile dysfunction. °· You do not exercise. °· You have certain medical conditions, including: °¨ Type 2 diabetes. °¨ Obesity. °¨ Heart and circulatory disease. °What are the signs or symptoms? °Symptoms of this condition include: °· Weak or interrupted urine stream. °· Dribbling or leaking urine. °· Feeling like the bladder has not emptied completely. °· Difficulty starting urination. °· Getting up frequently at night to urinate. °· Urinating more often (8 or more times a day). °· Accidental loss of urine (urinary incontinence). °· Pain during urination or ejaculation. °· Urine with an unusual smell or color. °The size of the prostate does not always determine the severity of the symptoms. For example, a man with a large prostate may experience minor symptoms, or a man with a smaller prostate may experience a severe blockage. °How is this diagnosed? °This condition may be diagnosed based on: °· Your medical history and symptoms. °· A physical exam. This usually  includes a digital rectal exam. During this exam, your health care provider places a gloved, lubricated finger into the rectum to feel the size of the prostate. °· A blood test. This test checks for high levels of a protein that is produced by the prostate (prostate specific antigen, PSA). °· Tests to examine how well the urethra and bladder are functioning (urodynamic tests). °· Cystoscopy. For this test, a small, tube-shaped instrument (cystoscope) is used to look inside the urethra and bladder. The cystoscope is placed into the urinary tract through the opening at the tip of the penis. °· Urine tests. °· Ultrasound. °How is this treated? °Treatment for this condition depends on how severe your symptoms are. Treatment may include: °· Active surveillance or "watchful waiting." If your symptoms are mild, your health care provider may delay treatment and ask you to keep track of your symptoms. You will have regular checkups to examine the size of your prostate, discuss symptoms, and determine whether treatment is needed. °· Medicines. These may be used to: °¨ Stop prostate growth. °¨ Shrink the prostate. °¨ Relieve symptoms. °· Lifestyle changes, including: °¨ Pelvic floor muscle exercises. The pelvic floor muscles are a group of muscles that relax when you urinate. °¨ Bladder training. This involves exercises that train the bladder to hold more urine for longer periods. °¨ Reducing the amount of liquid that you drink. This is especially important before sleeping and before long periods of time spent in public. °¨ Reducing the amount of caffeine and alcohol that you drink. °¨ Treating or preventing constipation. °·   preventing constipation.  Surgery to reduce the size of the prostate or widen the urethra. This is typically done if your symptoms are severe or there are serious complications from the enlarged prostate.  Follow these instructions at home: Medicines  Take over-the-counter and prescription medicines as told by your health  care provider.  Avoid certain medicines, such as decongestants, antihistamines, and some prescription medicines as told by your health care provider. Ask your health care provider which medicines you should avoid. General instructions  Monitor your symptoms for any changes. Tell your health care provider about any changes.  Give yourself time when you urinate.  Avoid certain beverages that can irritate the bladder, such as: ? Alcohol. ? Caffeinated drinks like coffee, tea, and cola.  Avoid drinking large amounts of liquid before bed or before going out in public.  Do pelvic floor muscle or bladder training exercises as told by your health care provider.  Keep all follow-up visits as told by your health care provider. This is important. Contact a health care provider if:  Your develop new or worse symptoms.  You have trouble getting or maintaining an erection.  You have a fever.  You have pain or burning during urination.  You have blood in your urine. Get help right away if:  You have severe pain when urinating.  You cannot urinate.  You have severe pain in your abdomen.  You are dizzy.  You faint.  You have severe back pain.  Your urine is dark red and difficult to see through.  You have large blood clots in your urine.  You have severe pain after an erection.  You have chest pain, dizziness, or nausea during sexual activity. Summary  The prostate is a gland that produces the fluid that goes into semen. It is near the opening to the bladder and it surrounds the tube that drains urine out of the body (urethra).  Benign prostatic hyperplasia is common among older men and it typically causes problems with urinating.  If your symptoms are mild, your health care provider may delay treatment and ask you to keep track of your symptoms. You will have regular checkups to examine the size of your prostate, discuss symptoms, and determine whether treatment is  needed.  If directed, you may need to avoid certain medicines, such as decongestants, antihistamines, and some prescription medicines.  Contact your health care provider if you develop new or worse symptoms. This information is not intended to replace advice given to you by your health care provider. Make sure you discuss any questions you have with your health care provider. Document Released: 08/08/2005 Document Revised: 06/27/2016 Document Reviewed: 06/27/2016 Elsevier Interactive Patient Education  2017 [place] with healthy eating, regular movement, and your holistic approach to life-GREAT JOB! We will call when lab results are available. Continue with Urology team as directed. Recommend annual follow up with [place], or sooner if needed. NICE TO MEET YOU!

## 2017-01-26 LAB — HEMOGLOBIN A1C
Est. average glucose Bld gHb Est-mCnc: 105 mg/dL
HEMOGLOBIN A1C: 5.3 % (ref 4.8–5.6)

## 2017-01-26 LAB — COMPREHENSIVE METABOLIC PANEL
ALBUMIN: 4.8 g/dL (ref 3.6–4.8)
ALK PHOS: 68 IU/L (ref 39–117)
ALT: 21 IU/L (ref 0–44)
AST: 24 IU/L (ref 0–40)
Albumin/Globulin Ratio: 2 (ref 1.2–2.2)
BILIRUBIN TOTAL: 0.9 mg/dL (ref 0.0–1.2)
BUN / CREAT RATIO: 27 — AB (ref 10–24)
BUN: 21 mg/dL (ref 8–27)
CHLORIDE: 102 mmol/L (ref 96–106)
CO2: 26 mmol/L (ref 18–29)
CREATININE: 0.77 mg/dL (ref 0.76–1.27)
Calcium: 10 mg/dL (ref 8.6–10.2)
GFR calc Af Amer: 109 mL/min/{1.73_m2} (ref >59)
GFR calc non Af Amer: 94 mL/min/{1.73_m2} (ref >59)
GLOBULIN, TOTAL: 2.4 g/dL (ref 1.5–4.5)
Glucose: 105 mg/dL — ABNORMAL HIGH (ref 65–99)
Potassium: 5.2 mmol/L (ref 3.5–5.2)
SODIUM: 143 mmol/L (ref 134–144)
TOTAL PROTEIN: 7.2 g/dL (ref 6.0–8.5)

## 2017-01-26 LAB — CBC WITH DIFFERENTIAL/PLATELET
BASOS: 1 %
Basophils Absolute: 0 10*3/uL (ref 0.0–0.2)
EOS (ABSOLUTE): 0.2 10*3/uL (ref 0.0–0.4)
Eos: 3 %
HEMATOCRIT: 48 % (ref 37.5–51.0)
HEMOGLOBIN: 15.8 g/dL (ref 13.0–17.7)
IMMATURE GRANS (ABS): 0 10*3/uL (ref 0.0–0.1)
Immature Granulocytes: 0 %
LYMPHS ABS: 1.5 10*3/uL (ref 0.7–3.1)
Lymphs: 24 %
MCH: 27.8 pg (ref 26.6–33.0)
MCHC: 32.9 g/dL (ref 31.5–35.7)
MCV: 85 fL (ref 79–97)
MONOCYTES: 11 %
Monocytes Absolute: 0.7 10*3/uL (ref 0.1–0.9)
NEUTROS ABS: 3.7 10*3/uL (ref 1.4–7.0)
Neutrophils: 61 %
Platelets: 251 10*3/uL (ref 150–379)
RBC: 5.68 x10E6/uL (ref 4.14–5.80)
RDW: 14.4 % (ref 12.3–15.4)
WBC: 6 10*3/uL (ref 3.4–10.8)

## 2017-01-26 LAB — PSA: Prostate Specific Ag, Serum: 6 ng/mL — ABNORMAL HIGH (ref 0.0–4.0)

## 2017-01-26 LAB — VITAMIN B12: VITAMIN B 12: 296 pg/mL (ref 232–1245)

## 2017-01-26 LAB — LIPID PANEL
CHOL/HDL RATIO: 3.1 ratio (ref 0.0–5.0)
Cholesterol, Total: 162 mg/dL (ref 100–199)
HDL: 53 mg/dL (ref >39)
LDL Calculated: 98 mg/dL (ref 0–99)
Triglycerides: 55 mg/dL (ref 0–149)
VLDL Cholesterol Cal: 11 mg/dL (ref 5–40)

## 2017-01-26 LAB — TSH: TSH: 1.11 u[IU]/mL (ref 0.450–4.500)

## 2017-01-26 LAB — VITAMIN D 25 HYDROXY (VIT D DEFICIENCY, FRACTURES): Vit D, 25-Hydroxy: 33.2 ng/mL (ref 30.0–100.0)

## 2017-02-01 DIAGNOSIS — R338 Other retention of urine: Secondary | ICD-10-CM | POA: Diagnosis not present

## 2017-02-01 DIAGNOSIS — N401 Enlarged prostate with lower urinary tract symptoms: Secondary | ICD-10-CM | POA: Diagnosis not present

## 2017-02-08 DIAGNOSIS — R338 Other retention of urine: Secondary | ICD-10-CM | POA: Diagnosis not present

## 2017-02-08 DIAGNOSIS — N401 Enlarged prostate with lower urinary tract symptoms: Secondary | ICD-10-CM | POA: Diagnosis not present

## 2017-02-14 ENCOUNTER — Encounter: Payer: Self-pay | Admitting: Urology

## 2017-02-14 ENCOUNTER — Ambulatory Visit (INDEPENDENT_AMBULATORY_CARE_PROVIDER_SITE_OTHER): Payer: Medicare Other | Admitting: Urology

## 2017-02-14 VITALS — BP 160/79 | HR 86 | Ht 69.0 in | Wt 158.0 lb

## 2017-02-14 DIAGNOSIS — R339 Retention of urine, unspecified: Secondary | ICD-10-CM | POA: Diagnosis not present

## 2017-02-14 DIAGNOSIS — N401 Enlarged prostate with lower urinary tract symptoms: Secondary | ICD-10-CM

## 2017-02-14 DIAGNOSIS — N138 Other obstructive and reflux uropathy: Secondary | ICD-10-CM | POA: Diagnosis not present

## 2017-02-14 DIAGNOSIS — R972 Elevated prostate specific antigen [PSA]: Secondary | ICD-10-CM

## 2017-02-14 NOTE — Progress Notes (Signed)
02/14/2017 11:16 AM   Steven Coleman May 26, 1951 [id number]  Referring provider: Esaw Grandchild, NP [place], [place] [address]  Chief Complaint  Patient presents with  . Elevated PSA    3 month     HPI: F/u -  1) urinary retention - developed Oct 2017 - A Foley catheter was placed. 2L was drained. He failed two voiding trials. He tried CIC with straight and coude tips but developed hematuria. Cystoscopy 08/01/2017 revealed trilobar hypertrophy with median lobe. Pt failed void trial / CIC again Jan 2018. s/p PAE-procedure 10/2016  - seen in ED for foley after he failed CIC Apr 2018.  He failed another void trial May 2018, but felt like he made it longer and CIC was easier. He went back to [facility] to f/u with IR. Repeat CT angiographic by report again showed a large median lobe and a few residual tributaries to the prostate but none amendable to embolization.  2) PSA elevation - PSA was drawn at time of foley/retention and found be 20.86. His DRE was normal Oct 2017. His PSA declined to 7.29 Aug 2016 prior to foley removal/void trial. Prostate 86 grams on TRUS Feb 2018 (PSAD = 0.09). He declined a prostate biopsy. PSA was repeated 02/10/2017 and noted to be 6.5.  3) BPH - started on Flomax in the ED Oct 2017 with retention episode. No prior history. Prostate 86 grams on U/S Feb 2018. He underwent PAE at [facility] Oct 28, 2016. Off tamsulosin.   NSAIDs recently added in the event there is "inflammation" after the PAE.   Today, patient seen for the above. He continues with a Foley catheter.  PMH: Past Medical History:  Diagnosis Date  . BPH (benign prostatic hyperplasia)     Surgical History: Past Surgical History:  Procedure Laterality Date  . EYE SURGERY    . 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 prostatic artery embolization    . SHOULDER SURGERY Left 1982  . TONSILLECTOMY AND ADENOIDECTOMY      Home Medications:  Allergies as of 02/14/2017   No Known Allergies       Medication List       Accurate as of 02/14/17 11:16 AM. Always use your most recent med list.          ibuprofen 400 MG tablet Commonly known as:  ADVIL,MOTRIN Take 400 mg by mouth every 6 (six) hours as needed.   tamsulosin 0.4 MG Caps capsule Commonly known as:  FLOMAX Take 0.4 mg by mouth.       Allergies: No Known Allergies  Family History: Family History  Problem Relation Age of Onset  . Cancer Mother        lung  . Cancer Father        throat  . Prostate cancer Neg Hx   . Kidney cancer Neg Hx   . Bladder Cancer Neg Hx     Social History:  reports that he has never smoked. He has never used smokeless tobacco. He reports that he does not drink alcohol or use drugs.  ROS: UROLOGY Frequent Urination?: No Hard to postpone urination?: No Burning/pain with urination?: No Get up at night to urinate?: No Leakage of urine?: No Urine stream starts and stops?: No Trouble starting stream?: No Do you have to strain to urinate?: No Blood in urine?: No Urinary tract infection?: No Sexually transmitted disease?: No Injury to kidneys or bladder?: No Painful intercourse?: No Weak stream?:  No Erection problems?: No Penile pain?: No  Gastrointestinal Nausea?: No Vomiting?: No Indigestion/heartburn?: No Diarrhea?: No Constipation?: No  Constitutional Fever: No Night sweats?: No Weight loss?: No Fatigue?: No  Skin Skin rash/lesions?: No Itching?: No  Eyes Blurred vision?: No Double vision?: No  Ears/Nose/Throat Sore throat?: No Sinus problems?: No  Hematologic/Lymphatic Swollen glands?: No Easy bruising?: No  Cardiovascular Leg swelling?: No Chest pain?: No  Respiratory Cough?: No Shortness of breath?: No  Endocrine Excessive thirst?: No  Musculoskeletal Back pain?: No Joint pain?: No  Neurological Headaches?: No Dizziness?: No  Psychologic Depression?: No Anxiety?: No  Physical Exam: BP (!) 160/79   Pulse 86   Ht 5\' 9"   (1.753 m)   Wt 71.7 kg (158 lb)   BMI 23.33 kg/m   Constitutional:  Alert and oriented, No acute distress. HEENT: [place] AT, moist mucus membranes.  Trachea midline, no masses. Cardiovascular: No clubbing, cyanosis, or edema. Respiratory: Normal respiratory effort, no increased work of breathing. GI: Abdomen is soft, nontender, nondistended, no abdominal masses Skin: No rashes, bruises or suspicious lesions. Neurologic: Grossly intact, no focal deficits, moving all 4 extremities. Psychiatric: Normal mood and affect.  Laboratory Data: Lab Results  Component Value Date   WBC 6.0 01/25/2017   HGB 15.8 01/25/2017   HCT 48.0 01/25/2017   MCV 85 01/25/2017   PLT 251 01/25/2017    Lab Results  Component Value Date   CREATININE 0.77 01/25/2017    Lab Results  Component Value Date   PSA 20.86 (H) 06/03/2016    No results found for: TESTOSTERONE  Lab Results  Component Value Date   HGBA1C 5.3 01/25/2017    Urinalysis    Component Value Date/Time   COLORURINE YELLOW (A) 01/21/2017 1135   APPEARANCEUR CLEAR (A) 01/21/2017 1135   APPEARANCEUR Cloudy (A) 08/01/2016 1119   LABSPEC 1.025 01/21/2017 1135   PHURINE 5.0 01/21/2017 1135   GLUCOSEU NEGATIVE 01/21/2017 1135   HGBUR SMALL (A) 01/21/2017 1135   BILIRUBINUR NEGATIVE 01/21/2017 1135   BILIRUBINUR Negative 08/01/2016 1119   KETONESUR 5 (A) 01/21/2017 1135   PROTEINUR NEGATIVE 01/21/2017 1135   NITRITE NEGATIVE 01/21/2017 1135   LEUKOCYTESUR MODERATE (A) 01/21/2017 1135   LEUKOCYTESUR 1+ (A) 08/01/2016 1119    Assessment & Plan:    1-urinary retention-likely from obstructing median lobe. Discussed the role of urodynamics. He'll consider. Given his median lobe anatomy I think he has a good likelihood of being able to void on his own after something like a holmium laser enucleation of the prostate or thulium prostate vaporization. He has read about and is interested in HoLEP. He wants to try to void on his own and try CIC.  We discussed the natural h/o of HoLEP and thulium after PAE isn't completely known and if there is higher risk of stricture, incontinence or BNC among other risks.   2-BPH-as above  3-elevated PSA-PSA remained stable, lower.  There are no diagnoses linked to this encounter.  No Follow-up on file.  Festus Aloe, [facility] [address] [place], [place] [address] [phone]

## 2017-03-06 ENCOUNTER — Telehealth: Payer: Self-pay | Admitting: Urology

## 2017-03-06 NOTE — Telephone Encounter (Signed)
Patient cancelled app said he is no longer self cathing and does not need this app  Sharyn Lull

## 2017-03-06 NOTE — Telephone Encounter (Signed)
Please let this patient know that he really should follow-up to make sure he is emptying his bladder completely. At minimum, he should at least check a few postvoid residuals by catheterizing after he voids to ensure these emptying his bladder.   Steven Espy, MD

## 2017-03-08 ENCOUNTER — Ambulatory Visit: Payer: Medicare Other | Admitting: Urology

## 2017-03-08 NOTE — Telephone Encounter (Addendum)
Spoke to spouse. Went over Dr. Cherrie Gauze previous note. Spouse verbalized understanding.  Spouse says patient is not having any problems, but agreed to giving him the message.

## 2017-07-19 ENCOUNTER — Other Ambulatory Visit: Payer: Self-pay

## 2017-07-19 MED ORDER — TAMSULOSIN HCL 0.4 MG PO CAPS: 0.4000 mg | ORAL_CAPSULE | Freq: Every day | ORAL | 11 refills | Status: DC

## 2018-05-28 ENCOUNTER — Emergency Department
Admission: EM | Admit: 2018-05-28 | Discharge: 2018-05-28 | Discharge status: Against Medical Advice | Payer: Medicare Other | Source: Home / Self Care | Attending: Emergency Medicine | Admitting: Emergency Medicine

## 2018-05-28 ENCOUNTER — Other Ambulatory Visit: Payer: Self-pay

## 2018-05-28 ENCOUNTER — Emergency Department: Payer: Medicare Other

## 2018-05-28 ENCOUNTER — Encounter: Payer: Self-pay | Admitting: Adult Health

## 2018-05-28 ENCOUNTER — Ambulatory Visit (INDEPENDENT_AMBULATORY_CARE_PROVIDER_SITE_OTHER): Payer: Medicare Other | Admitting: Adult Health

## 2018-05-28 VITALS — BP 112/61 | HR 111 | Temp 101.3°F | Ht 69.0 in | Wt 164.1 lb

## 2018-05-28 DIAGNOSIS — R109 Unspecified abdominal pain: Secondary | ICD-10-CM | POA: Diagnosis not present

## 2018-05-28 DIAGNOSIS — R7881 Bacteremia: Secondary | ICD-10-CM | POA: Diagnosis not present

## 2018-05-28 DIAGNOSIS — R6520 Severe sepsis without septic shock: Secondary | ICD-10-CM

## 2018-05-28 DIAGNOSIS — A419 Sepsis, unspecified organism: Secondary | ICD-10-CM

## 2018-05-28 DIAGNOSIS — A4181 Sepsis due to Enterococcus: Secondary | ICD-10-CM | POA: Diagnosis not present

## 2018-05-28 DIAGNOSIS — R509 Fever, unspecified: Secondary | ICD-10-CM | POA: Diagnosis not present

## 2018-05-28 DIAGNOSIS — N12 Tubulo-interstitial nephritis, not specified as acute or chronic: Secondary | ICD-10-CM

## 2018-05-28 DIAGNOSIS — B952 Enterococcus as the cause of diseases classified elsewhere: Secondary | ICD-10-CM | POA: Diagnosis not present

## 2018-05-28 DIAGNOSIS — E876 Hypokalemia: Secondary | ICD-10-CM | POA: Diagnosis not present

## 2018-05-28 DIAGNOSIS — N23 Unspecified renal colic: Secondary | ICD-10-CM | POA: Diagnosis not present

## 2018-05-28 DIAGNOSIS — N179 Acute kidney failure, unspecified: Secondary | ICD-10-CM

## 2018-05-28 DIAGNOSIS — N10 Acute tubulo-interstitial nephritis: Secondary | ICD-10-CM | POA: Insufficient documentation

## 2018-05-28 DIAGNOSIS — R8290 Unspecified abnormal findings in urine: Secondary | ICD-10-CM | POA: Insufficient documentation

## 2018-05-28 DIAGNOSIS — I700 Atherosclerosis of aorta: Secondary | ICD-10-CM | POA: Diagnosis not present

## 2018-05-28 DIAGNOSIS — N132 Hydronephrosis with renal and ureteral calculous obstruction: Secondary | ICD-10-CM | POA: Diagnosis not present

## 2018-05-28 DIAGNOSIS — N136 Pyonephrosis: Secondary | ICD-10-CM | POA: Diagnosis not present

## 2018-05-28 LAB — URINALYSIS, COMPLETE (UACMP) WITH MICROSCOPIC
Bilirubin Urine: NEGATIVE
Glucose, UA: NEGATIVE mg/dL
KETONES UR: 20 mg/dL — AB
Nitrite: NEGATIVE
Protein, ur: NEGATIVE mg/dL
SPECIFIC GRAVITY, URINE: 1.012 (ref 1.005–1.030)
pH: 6 (ref 5.0–8.0)

## 2018-05-28 LAB — CBC
HCT: 45.4 % (ref 40.0–52.0)
Hemoglobin: 14.7 g/dL (ref 13.0–18.0)
MCH: 29.9 pg (ref 26.0–34.0)
MCHC: 32.3 g/dL (ref 32.0–36.0)
MCV: 92.6 fL (ref 80.0–100.0)
Platelets: 183 10*3/uL (ref 150–440)
RBC: 4.9 MIL/uL (ref 4.40–5.90)
RDW: 14.1 % (ref 11.5–14.5)
WBC: 24.2 10*3/uL — AB (ref 3.8–10.6)

## 2018-05-28 LAB — COMPREHENSIVE METABOLIC PANEL
ALT: 18 U/L (ref 0–44)
AST: 27 U/L (ref 15–41)
Albumin: 4.2 g/dL (ref 3.5–5.0)
Alkaline Phosphatase: 48 U/L (ref 38–126)
Anion gap: 11 (ref 5–15)
BUN: 28 mg/dL — AB (ref 8–23)
CHLORIDE: 101 mmol/L (ref 98–111)
CO2: 22 mmol/L (ref 22–32)
CREATININE: 1.51 mg/dL — AB (ref 0.61–1.24)
Calcium: 8.6 mg/dL — ABNORMAL LOW (ref 8.9–10.3)
GFR calc Af Amer: 53 mL/min — ABNORMAL LOW (ref >60)
GFR calc non Af Amer: 46 mL/min — ABNORMAL LOW (ref >60)
GLUCOSE: 105 mg/dL — AB (ref 70–99)
Potassium: 3.2 mmol/L — ABNORMAL LOW (ref 3.5–5.1)
SODIUM: 134 mmol/L — AB (ref 135–145)
Total Bilirubin: 1.3 mg/dL — ABNORMAL HIGH (ref 0.3–1.2)
Total Protein: 6.7 g/dL (ref 6.5–8.1)

## 2018-05-28 LAB — POCT URINALYSIS DIPSTICK
BILIRUBIN UA: NEGATIVE
Glucose, UA: NEGATIVE
KETONES UA: 40
NITRITE UA: POSITIVE
PH UA: 6 (ref 5.0–8.0)
PROTEIN UA: POSITIVE — AB
Spec Grav, UA: 1.015 (ref 1.010–1.025)
UROBILINOGEN UA: 0.2 U/dL

## 2018-05-28 LAB — LACTIC ACID, PLASMA: Lactic Acid, Venous: 1.5 mmol/L (ref 0.5–1.9)

## 2018-05-28 MED ORDER — SODIUM CHLORIDE 0.9 % IV SOLN
1.0000 g | INTRAVENOUS | Status: DC
  Administered 2018-05-28: 1 g via INTRAVENOUS
  Filled 2018-05-28: qty 10

## 2018-05-28 MED ORDER — ONDANSETRON 4 MG PO TBDP: 4.0000 mg | ORAL_TABLET | Freq: Three times a day (TID) | ORAL | 0 refills | Status: DC | PRN

## 2018-05-28 MED ORDER — SULFAMETHOXAZOLE-TRIMETHOPRIM 800-160 MG PO TABS
1.0000 | ORAL_TABLET | Freq: Once | ORAL | Status: AC
  Administered 2018-05-28: 1 via ORAL
  Filled 2018-05-28: qty 1

## 2018-05-28 MED ORDER — SULFAMETHOXAZOLE-TRIMETHOPRIM 800-160 MG PO TABS: 1.0000 | ORAL_TABLET | Freq: Two times a day (BID) | ORAL | 0 refills | Status: DC

## 2018-05-28 MED ORDER — SODIUM CHLORIDE 0.9 % IV BOLUS (SEPSIS)
1000.0000 mL | Freq: Once | INTRAVENOUS | Status: AC
  Administered 2018-05-28: 1000 mL via INTRAVENOUS

## 2018-05-28 MED ORDER — SODIUM CHLORIDE 0.9 % IV BOLUS (SEPSIS)
250.0000 mL | Freq: Once | INTRAVENOUS | Status: AC
  Administered 2018-05-28: 250 mL via INTRAVENOUS

## 2018-05-28 MED ORDER — CEFDINIR 300 MG PO CAPS: 300.0000 mg | ORAL_CAPSULE | Freq: Two times a day (BID) | ORAL | 0 refills | Status: DC

## 2018-05-28 NOTE — Assessment & Plan Note (Signed)
Current temp 101.3 f Last dose of Ibuprofen was 1100

## 2018-05-28 NOTE — ED Notes (Signed)
Pt agrees to treatment plan by Joni Fears, MD.

## 2018-05-28 NOTE — Progress Notes (Signed)
CODE SEPSIS - PHARMACY COMMUNICATION  **Broad Spectrum Antibiotics should be administered within 1 hour of Sepsis diagnosis**  Time Code Sepsis Called/Page Received: 1906  Antibiotics Ordered: ceftriaxone  Time of 1st antibiotic administration: 2010  Additional action taken by pharmacy: spoke with patient's nurse who informed me that the delay in this patient receiving rapid antibiotic administration was caused by the patient initially refusing care   If necessary, Name of Provider/Nurse Contacted: Clayburn Pert ,PharmD Clinical Pharmacist  05/28/2018  8:20 PM

## 2018-05-28 NOTE — ED Triage Notes (Signed)
Pt arrived from PCP - they sent him to the ED for UTI and stated that he needed IV antibiotics

## 2018-05-28 NOTE — ED Provider Notes (Signed)
[facility] Emergency Department Provider Note  ____________________________________________  Time seen: Approximately 9:57 PM  I have reviewed the triage vital signs and the nursing notes.   HISTORY  Chief Complaint Urinary Tract Infection and Flank Pain    HPI Steven Coleman is a 67 y.o. male with a history of BPH who was sent to the ED today from primary care due to concerns about pyelonephritis.   He has been having right flank pain that started yesterday, associated with fever to 102 at home, chills.  Pain is nonradiating.  No dysuria frequency urgency or hematuria.  He has a history of BPH and urinary tract infections in the past related to urinary retention.  He reports normal oral intake, no vomiting or diarrhea.  No dizziness or syncope.  No Decreased urine output. Currently patient rates his flank pain as mild.    Past Medical History:  Diagnosis Date  . BPH (benign prostatic hyperplasia)      Patient Active Problem List   Diagnosis Date Noted  . Flank pain 05/28/2018  . Fever 05/28/2018  . Abnormal urinalysis 05/28/2018  . Health care maintenance 01/25/2017  . Acute urinary retention 06/04/2016  . Elevated PSA 06/03/2016     Past Surgical History:  Procedure Laterality Date  . EYE SURGERY    . 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 prostatic artery embolization    . SHOULDER SURGERY Left 1982  . TONSILLECTOMY AND ADENOIDECTOMY       Prior to Admission medications   Medication Sig Start Date End Date Taking? Authorizing Provider  cefdinir (OMNICEF) 300 MG capsule Take 1 capsule (300 mg total) by mouth 2 (two) times daily. 05/28/18   Carrie Mew, MD  ibuprofen (ADVIL,MOTRIN) 400 MG tablet Take 400 mg by mouth every 6 (six) hours as needed.    [provider]  ondansetron (ZOFRAN ODT) 4 MG disintegrating tablet Take 1 tablet (4 mg total) by mouth every 8 (eight) hours as needed for nausea or vomiting. 05/28/18    Carrie Mew, MD  sulfamethoxazole-trimethoprim (BACTRIM DS) 800-160 MG tablet Take 1 tablet by mouth 2 (two) times daily. 05/28/18   Carrie Mew, MD  tamsulosin (FLOMAX) 0.4 MG CAPS capsule Take 1 capsule (0.4 mg total) by mouth daily. 07/19/17   Festus Aloe, MD     Allergies Patient has no known allergies.   Family History  Problem Relation Age of Onset  . Cancer Mother        lung  . Cancer Father        throat  . Prostate cancer Neg Hx   . Kidney cancer Neg Hx   . Bladder Cancer Neg Hx     Social History Social History   Tobacco Use  . Smoking status: Never Smoker  . Smokeless tobacco: Never Used  Substance Use Topics  . Alcohol use: Yes    Alcohol/week: 1.0 standard drinks    Types: 1 Glasses of wine per week    Comment: weekly  . Drug use: No    Review of Systems  Constitutional:   Positive fever and chills.  Cardiovascular:   No chest pain or syncope. Respiratory:   No dyspnea or cough. Gastrointestinal:   Positive right flank pain, negative for abdominal pain, vomiting and diarrhea.  Musculoskeletal:   Negative for focal pain or swelling All other systems reviewed and are negative except as documented above in ROS and HPI.  ____________________________________________   PHYSICAL EXAM:  VITAL  SIGNS: ED Triage Vitals  Enc Vitals Group     BP 05/28/18 1656 119/82     Pulse Rate 05/28/18 1656 (!) 117     Resp 05/28/18 1656 16     Temp 05/28/18 1656 98.2 F (36.8 C)     Temp Source 05/28/18 1656 Oral     SpO2 05/28/18 1656 96 %     Weight 05/28/18 1656 164 lb (74.4 kg)     Height 05/28/18 1656 5\' 9"  (1.753 m)     Head Circumference --      Peak Flow --      Pain Score 05/28/18 1655 2     Pain Loc --      Pain Edu? --      Excl. in [place]? --     Vital signs reviewed, nursing assessments reviewed.   Constitutional:   Alert and oriented. Non-toxic appearance. Eyes:   Conjunctivae are normal. EOMI. PERRL. ENT      Head:    Normocephalic and atraumatic.      Nose:   No congestion/rhinnorhea.       Mouth/Throat:   MMM, no pharyngeal erythema. No peritonsillar mass.       Neck:   No meningismus. Full ROM. Hematological/Lymphatic/Immunilogical:   No cervical lymphadenopathy. Cardiovascular:   Tachycardia heart rate 110. Symmetric bilateral radial and DP pulses.  No murmurs. Cap refill less than 2 seconds. Respiratory:   Normal respiratory effort without tachypnea/retractions. Breath sounds are clear and equal bilaterally. No wheezes/rales/rhonchi. Gastrointestinal:   Soft and nontender. Non distended. There is right side CVA tenderness.  No rebound, rigidity, or guarding. Musculoskeletal:   Normal range of motion in all extremities. No joint effusions.  No lower extremity tenderness.  No edema. Neurologic:   Normal speech and language.  Motor grossly intact. No acute focal neurologic deficits are appreciated.  Skin:    Skin is warm, dry and intact. No rash noted.  No petechiae, purpura, or bullae.  ____________________________________________    LABS (pertinent positives/negatives) (all labs ordered are listed, but only abnormal results are displayed) Labs Reviewed  URINALYSIS, COMPLETE (UACMP) WITH MICROSCOPIC - Abnormal; Notable for the following components:      Result Value   Color, Urine YELLOW (*)    APPearance CLOUDY (*)    Hgb urine dipstick MODERATE (*)    Ketones, ur 20 (*)    Leukocytes, UA LARGE (*)    WBC, UA >50 (*)    Bacteria, UA RARE (*)    Non Squamous Epithelial PRESENT (*)    All other components within normal limits  CBC - Abnormal; Notable for the following components:   WBC 24.2 (*)    All other components within normal limits  COMPREHENSIVE METABOLIC PANEL - Abnormal; Notable for the following components:   Sodium 134 (*)    Potassium 3.2 (*)    Glucose, Bld 105 (*)    BUN 28 (*)    Creatinine, Ser 1.51 (*)    Calcium 8.6 (*)    Total Bilirubin 1.3 (*)    GFR calc non Af  Amer 46 (*)    GFR calc Af Amer 53 (*)    All other components within normal limits  CULTURE, BLOOD (ROUTINE X 2)  CULTURE, BLOOD (ROUTINE X 2)  URINE CULTURE  LACTIC ACID, PLASMA  LACTIC ACID, PLASMA   ____________________________________________   EKG    ____________________________________________    RADIOLOGY  No results found.  ____________________________________________   PROCEDURES .Critical Care Performed  by: Carrie Mew, MD Authorized by: Carrie Mew, MD   Critical care provider statement:    Critical care time (minutes):  30   Critical care time was exclusive of:  Separately billable procedures and treating other patients   Critical care was necessary to treat or prevent imminent or life-threatening deterioration of the following conditions:  Sepsis   Critical care was time spent personally by me on the following activities:  Development of treatment plan with patient or surrogate, discussions with consultants, evaluation of patient's response to treatment, examination of patient, obtaining history from patient or surrogate, ordering and performing treatments and interventions, ordering and review of laboratory studies, ordering and review of radiographic studies, pulse oximetry, re-evaluation of patient's condition and review of old charts    ____________________________________________  DIFFERENTIAL DIAGNOSIS   UTI, pneumonia, sepsis, pyelonephritis.  Doubt ureterolithiasis or urinary retention at this time.  Doubt soft tissue infection   CLINICAL IMPRESSION / ASSESSMENT AND PLAN / ED COURSE  Pertinent labs & imaging results that were available during my care of the patient were reviewed by me and considered in my medical decision making (see chart for details).      Clinical Course as of May 28 2156  Molli Knock May 28, 2018  1906 Patient had documented fever in clinic earlier today.  Presents with tachycardia and pronounced leukocytosis of  24,000.  Clear urinary tract infection and acute renal insufficiency, all consistent with severe sepsis.  Initiate code sepsis, urine culture, blood cultures, IV ceftriaxone 1 g.  2 L IV fluid bolus.  Patient will need admission for further care.   [PS]  1945 Pt desires to go home, resistant to admission for sepsis care.  Advised him we recommend admission for this severe infection. Has MDM capacity. Agrees to checking cultures, lactate. Refuses cxr.    [PS]    Clinical Course User Index [PS] Carrie Mew, MD    ----------------------------------------- 10:01 PM on 05/28/2018 -----------------------------------------  Lactate 1.5.  Discussed the significance of this and that it is a very limited test but not supportive of a diagnosis of severe sepsis.  Reiterated that he does have acute renal insufficiency and a very high white blood cell count in addition to tachycardia and fever that were observed earlier today.  Patient reports that his pain is resolved after receiving IV fluids.  He feels normal and is ambulatory and comfortable.  I warned him about the possibility of his condition worsening before the antibiotics are fully effective, but he is comfortable monitoring this at home.  Encouraged him to return to the ED at any point if he changes his mind or has worsening symptoms.  Due to history of resistant organisms on previous UTI, I will cover him with a third-generation cephalosporin as well as Bactrim to maximize the possibility of efficacious treatment and minimize deterioration or other complications.  Advise follow-up with primary care tomorrow and close monitoring of his symptoms.  At this point plan to discharge [facility] since patient is refusing admission and chooses to go home.   ____________________________________________   FINAL CLINICAL IMPRESSION(S) / ED DIAGNOSES    Final diagnoses:  Pyelonephritis  Sepsis with acute renal failure, due to unspecified  organism, unspecified acute renal failure type, unspecified whether septic shock present [facility])     ED Discharge Orders         Ordered    cefdinir (OMNICEF) 300 MG capsule  2 times daily     05/28/18 2156  sulfamethoxazole-trimethoprim (BACTRIM DS) 800-160 MG tablet  2 times daily    Note to Pharmacy:  Double covering UTI/pyelonephritis due to h/o resistant organisms   05/28/18 2156    ondansetron (ZOFRAN ODT) 4 MG disintegrating tablet  Every 8 hours PRN     05/28/18 2156          Portions of this note were generated with dragon dictation software. Dictation errors may occur despite best attempts at proofreading.    Carrie Mew, MD 05/28/18 2203

## 2018-05-28 NOTE — Assessment & Plan Note (Signed)
UA Blo Mod Nit Pos Leu Large Temp 101.3, last dose of Ibuprofen 1100 Advised to be seen at nearest ED for IV ABX

## 2018-05-28 NOTE — ED Notes (Signed)
Pt refused EKG.  MD made aware.

## 2018-05-28 NOTE — ED Notes (Signed)
MD in room due to pt refusing care.

## 2018-05-28 NOTE — Patient Instructions (Signed)
Pyelonephritis, Adult Pyelonephritis is a kidney infection. The kidneys are the organs that filter a person's blood and move waste out of the bloodstream and into the urine. Urine passes from the kidneys, through the ureters, and into the bladder. There are two main types of pyelonephritis:  Infections that come on quickly without any warning (acute pyelonephritis).  Infections that last for a long period of time (chronic pyelonephritis).  In most cases, the infection clears up with treatment and does not cause further problems. More severe infections or chronic infections can sometimes spread to the bloodstream or lead to other problems with the kidneys. What are the causes? This condition is usually caused by:  Bacteria traveling from the bladder to the kidney through infected urine. The urine in the bladder can become infected with bacteria from: ? Bladder infection (cystitis). ? Inflammation of the prostate gland (prostatitis). ? Sexual intercourse, in females.  Bacteria traveling from the bloodstream to the kidney.  What increases the risk? This condition is more likely to develop in:  Pregnant women.  Older people.  People who have diabetes.  People who have kidney stones or bladder stones.  People who have other abnormalities of the kidney or ureter.  People who have a catheter placed in the bladder.  People who have cancer.  People who are sexually active.  Women who use spermicides.  People who have had a prior urinary tract infection.  What are the signs or symptoms? Symptoms of this condition include:  Frequent urination.  Strong or persistent urge to urinate.  Burning or stinging when urinating.  Abdominal pain.  Back pain.  Pain in the side or flank area.  Fever.  Chills.  Blood in the urine, or dark urine.  Nausea.  Vomiting.  How is this diagnosed? This condition may be diagnosed based on:  Medical history and physical exam.  Urine  tests.  Blood tests.  You may also have imaging tests of the kidneys, such as an ultrasound or CT scan. How is this treated? Treatment for this condition may depend on the severity of the infection.  If the infection is mild and is found early, you may be treated with antibiotic medicines taken by mouth. You will need to drink fluids to remain hydrated.  If the infection is more severe, you may need to stay in the hospital and receive antibiotics given directly into a vein through an IV tube. You may also need to receive fluids through an IV tube if you are not able to remain hydrated. After your hospital stay, you may need to take oral antibiotics for a period of time.  Other treatments may be required, depending on the cause of the infection. Follow these instructions at home: Medicines  Take over-the-counter and prescription medicines only as told by your health care provider.  If you were prescribed an antibiotic medicine, take it as told by your health care provider. Do not stop taking the antibiotic even if you start to feel better. General instructions  Drink enough fluid to keep your urine clear or pale yellow.  Avoid caffeine, tea, and carbonated beverages. They tend to irritate the bladder.  Urinate often. Avoid holding in urine for long periods of time.  Urinate before and after sex.  After a bowel movement, women should cleanse from front to back. Use each tissue only once.  Keep all follow-up visits as told by your health care provider. This is important. Contact a health care provider if:  Your symptoms   do not get better after 2 days of treatment.  Your symptoms get worse.  You have a fever. Get help right away if:  You are unable to take your antibiotics or fluids.  You have shaking chills.  You vomit.  You have severe flank or back pain.  You have extreme weakness or fainting. This information is not intended to replace advice given to you by your  health care provider. Make sure you discuss any questions you have with your health care provider. Document Released: 08/08/2005 Document Revised: 01/14/2016 Document Reviewed: 12/01/2014 Elsevier Interactive Patient Education  [employer].  Please proceed to nearest emergency room for immediate evaluation and treatment of UTI and Pyelonephritis. FEEL BETTER!

## 2018-05-28 NOTE — Assessment & Plan Note (Signed)
  Please proceed to nearest emergency room for immediate evaluation and treatment of UTI and Pyelonephritis. Pt and his wife communicated understanding and agreement

## 2018-05-28 NOTE — Progress Notes (Signed)
Subjective:    Patient ID: Steven Coleman, male    DOB: 1950-12-04, 67 y.o.   MRN: [id number]  HPI:  Steven Coleman presents with severe R flank pain, fever, and chills that started abruptly 20 hrs ago.  Pain is constant throbbing, rated 9/10. He has been taking OTC Ibuprofen for pain and fever, last dose at 1100 this morning, current temp 101.3 f He denies change in urinary habits- voids every few hours, small amounts. He usually experiences hematuria after his vigorous morning walk, then urine will clear up. He has urologist that treats him for BPH/urinary retention with tamsulosin- last OV 01/2017. He also report nausea without vomiting and poor appetite. Wife at [facility] during [place] She is insistent that he not be treated with Ciprofloxacin "due to all of the side effects of the medication".  Patient Care Team    Relationship Specialty Notifications Start End  Esaw Grandchild, NP PCP - General Family Medicine  01/25/17     Patient Active Problem List   Diagnosis Date Noted  . Flank pain 05/28/2018  . Fever 05/28/2018  . Abnormal urinalysis 05/28/2018  . Health care maintenance 01/25/2017  . Acute urinary retention 06/04/2016  . Elevated PSA 06/03/2016     Past Medical History:  Diagnosis Date  . BPH (benign prostatic hyperplasia)      Past Surgical History:  Procedure Laterality Date  . EYE SURGERY    . 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 prostatic artery embolization    . SHOULDER SURGERY Left 1982  . TONSILLECTOMY AND ADENOIDECTOMY       Family History  Problem Relation Age of Onset  . Cancer Mother        lung  . Cancer Father        throat  . Prostate cancer Neg Hx   . Kidney cancer Neg Hx   . Bladder Cancer Neg Hx      Social History   Substance and Sexual Activity  Drug Use No     Social History   Substance and Sexual Activity  Alcohol Use No   Comment: wine     Social History   Tobacco Use  Smoking Status Never Smoker  Smokeless Tobacco  Never Used     Outpatient Encounter Medications as of 05/28/2018  Medication Sig  . ibuprofen (ADVIL,MOTRIN) 400 MG tablet Take 400 mg by mouth every 6 (six) hours as needed.  . tamsulosin (FLOMAX) 0.4 MG CAPS capsule Take 1 capsule (0.4 mg total) by mouth daily.   Facility-Administered Encounter Medications as of 05/28/2018  Medication  . ciprofloxacin (CIPRO) tablet 500 mg  . lidocaine (XYLOCAINE) 2 % jelly 1 application    Allergies: Patient has no known allergies.  Body mass index is 24.23 kg/m.  Blood pressure 112/61, pulse (!) 111, temperature (!) 101.3 F (38.5 C), temperature source Oral, height 5\' 9"  (1.753 m), weight 164 lb 1.6 oz (74.4 kg), SpO2 96 %.  Review of Systems  Constitutional: Positive for activity change, appetite change, chills, diaphoresis, fatigue and fever. Negative for unexpected weight change.  Eyes: Negative for visual disturbance.  Respiratory: Negative for cough, chest tightness, shortness of breath, wheezing and stridor.   Cardiovascular: Negative for chest pain, palpitations and leg swelling.  Gastrointestinal: Positive for abdominal pain and nausea. Negative for abdominal distention, anal bleeding, blood in stool, constipation, diarrhea and vomiting.  Endocrine: Negative for cold intolerance, heat intolerance, polydipsia, polyphagia and polyuria.  Genitourinary: Positive for difficulty  urinating, flank pain, frequency and hematuria. Negative for dysuria.  Musculoskeletal: Positive for back pain.  Neurological: Negative for dizziness and headaches.  Hematological: Does not bruise/bleed easily.  Psychiatric/Behavioral: Positive for sleep disturbance.       Objective:   Physical Exam  Constitutional: He is oriented to person, place, and time. He appears well-developed and well-nourished. No distress.  HENT:  Head: Normocephalic and atraumatic.  Right Ear: External ear normal.  Left Ear: External ear normal.  Nose: Nose normal.  Mouth/Throat:  Oropharynx is clear and moist.  Eyes: Pupils are equal, round, and reactive to light. Conjunctivae and EOM are normal.  Cardiovascular: Normal rate, regular rhythm, normal heart sounds and intact distal pulses.  No murmur heard. Pulmonary/Chest: Effort normal and breath sounds normal. No stridor. No respiratory distress. He has no wheezes. He has no rales. He exhibits no tenderness.  Abdominal: Soft. Bowel sounds are normal. He exhibits no distension and no mass. There is no tenderness. There is CVA tenderness. There is no rigidity, no rebound, no guarding, no tenderness at McBurney's point and negative Murphy's sign. No hernia.  Musculoskeletal: Normal range of motion.  Neurological: He is alert and oriented to person, place, and time.  Skin: Skin is warm and dry. Capillary refill takes less than 2 seconds. He is not diaphoretic. No erythema. No pallor.  Psychiatric: He has a normal mood and affect. His behavior is normal. Judgment and thought content normal.  Nursing note and vitals reviewed.         Assessment & Plan:   1. Flank pain   2. Fever, unspecified fever cause   3. Abnormal urinalysis     Abnormal urinalysis UA Blo Mod Nit Pos Leu Large Temp 101.3, last dose of Ibuprofen 1100 Advised to be seen at nearest ED for IV ABX  Flank pain  Please proceed to nearest emergency room for immediate evaluation and treatment of UTI and Pyelonephritis. Pt and his wife communicated understanding and agreement  Fever Current temp 101.3 f Last dose of Ibuprofen was 1100    FOLLOW-UP:  Return if symptoms worsen or fail to improve.

## 2018-05-29 ENCOUNTER — Encounter: Payer: Self-pay | Admitting: Emergency Medicine

## 2018-05-29 ENCOUNTER — Other Ambulatory Visit: Payer: Self-pay

## 2018-05-29 ENCOUNTER — Encounter
Admission: EM | Disposition: A | Discharge status: Home Health | Payer: Self-pay | Source: Home / Self Care | Attending: Internal Medicine

## 2018-05-29 ENCOUNTER — Inpatient Hospital Stay
Admission: EM | Admit: 2018-05-29 | Discharge: 2018-05-31 | DRG: 854 | Disposition: A | Discharge status: Home Health | Payer: Medicare Other | Attending: Internal Medicine | Admitting: Internal Medicine

## 2018-05-29 ENCOUNTER — Telehealth: Payer: Self-pay

## 2018-05-29 ENCOUNTER — Inpatient Hospital Stay: Payer: Medicare Other | Admitting: Anesthesiology

## 2018-05-29 ENCOUNTER — Emergency Department: Payer: Medicare Other

## 2018-05-29 ENCOUNTER — Telehealth: Payer: Self-pay | Admitting: Emergency Medicine

## 2018-05-29 DIAGNOSIS — N136 Pyonephrosis: Secondary | ICD-10-CM | POA: Diagnosis present

## 2018-05-29 DIAGNOSIS — Z79899 Other long term (current) drug therapy: Secondary | ICD-10-CM

## 2018-05-29 DIAGNOSIS — N401 Enlarged prostate with lower urinary tract symptoms: Secondary | ICD-10-CM | POA: Diagnosis present

## 2018-05-29 DIAGNOSIS — R338 Other retention of urine: Secondary | ICD-10-CM | POA: Diagnosis present

## 2018-05-29 DIAGNOSIS — Z87440 Personal history of urinary (tract) infections: Secondary | ICD-10-CM | POA: Diagnosis not present

## 2018-05-29 DIAGNOSIS — B952 Enterococcus as the cause of diseases classified elsewhere: Secondary | ICD-10-CM | POA: Diagnosis not present

## 2018-05-29 DIAGNOSIS — E876 Hypokalemia: Secondary | ICD-10-CM | POA: Diagnosis present

## 2018-05-29 DIAGNOSIS — A4181 Sepsis due to Enterococcus: Secondary | ICD-10-CM | POA: Diagnosis present

## 2018-05-29 DIAGNOSIS — R3914 Feeling of incomplete bladder emptying: Secondary | ICD-10-CM | POA: Diagnosis not present

## 2018-05-29 DIAGNOSIS — R6520 Severe sepsis without septic shock: Secondary | ICD-10-CM | POA: Diagnosis present

## 2018-05-29 DIAGNOSIS — N179 Acute kidney failure, unspecified: Secondary | ICD-10-CM | POA: Diagnosis present

## 2018-05-29 DIAGNOSIS — I700 Atherosclerosis of aorta: Secondary | ICD-10-CM | POA: Diagnosis present

## 2018-05-29 DIAGNOSIS — R319 Hematuria, unspecified: Secondary | ICD-10-CM

## 2018-05-29 DIAGNOSIS — R300 Dysuria: Secondary | ICD-10-CM | POA: Diagnosis not present

## 2018-05-29 DIAGNOSIS — N132 Hydronephrosis with renal and ureteral calculous obstruction: Secondary | ICD-10-CM | POA: Diagnosis not present

## 2018-05-29 DIAGNOSIS — N210 Calculus in bladder: Secondary | ICD-10-CM | POA: Diagnosis present

## 2018-05-29 DIAGNOSIS — R350 Frequency of micturition: Secondary | ICD-10-CM | POA: Diagnosis not present

## 2018-05-29 DIAGNOSIS — A419 Sepsis, unspecified organism: Secondary | ICD-10-CM | POA: Diagnosis not present

## 2018-05-29 DIAGNOSIS — N23 Unspecified renal colic: Secondary | ICD-10-CM | POA: Diagnosis not present

## 2018-05-29 DIAGNOSIS — N201 Calculus of ureter: Secondary | ICD-10-CM | POA: Diagnosis not present

## 2018-05-29 DIAGNOSIS — R7881 Bacteremia: Secondary | ICD-10-CM | POA: Diagnosis not present

## 2018-05-29 DIAGNOSIS — N200 Calculus of kidney: Secondary | ICD-10-CM | POA: Diagnosis not present

## 2018-05-29 HISTORY — PX: CYSTOSCOPY WITH STENT PLACEMENT: SHX5790

## 2018-05-29 LAB — CBC WITH DIFFERENTIAL/PLATELET
Abs Immature Granulocytes: 0.32 10*3/uL — ABNORMAL HIGH (ref 0.00–0.07)
Basophils Absolute: 0.1 10*3/uL (ref 0.0–0.1)
Basophils Relative: 0 %
EOS PCT: 0 %
Eosinophils Absolute: 0 10*3/uL (ref 0.0–0.5)
HEMATOCRIT: 40.9 % (ref 39.0–52.0)
HEMOGLOBIN: 13.8 g/dL (ref 13.0–17.0)
Immature Granulocytes: 1 %
LYMPHS PCT: 4 %
Lymphs Abs: 0.9 10*3/uL (ref 0.7–4.0)
MCH: 30.3 pg (ref 26.0–34.0)
MCHC: 33.7 g/dL (ref 30.0–36.0)
MCV: 89.7 fL (ref 80.0–100.0)
MONO ABS: 1.5 10*3/uL — AB (ref 0.1–1.0)
Monocytes Relative: 7 %
Neutro Abs: 19.3 10*3/uL — ABNORMAL HIGH (ref 1.7–7.7)
Neutrophils Relative %: 88 %
Platelets: 156 10*3/uL (ref 150–400)
RBC: 4.56 MIL/uL (ref 4.22–5.81)
RDW: 13.7 % (ref 11.5–15.5)
WBC: 22.2 10*3/uL — AB (ref 4.0–10.5)
nRBC: 0 % (ref 0.0–0.2)

## 2018-05-29 LAB — BLOOD CULTURE ID PANEL (REFLEXED)
ACINETOBACTER BAUMANNII: NOT DETECTED
CANDIDA ALBICANS: NOT DETECTED
CANDIDA GLABRATA: NOT DETECTED
Candida krusei: NOT DETECTED
Candida parapsilosis: NOT DETECTED
Candida tropicalis: NOT DETECTED
Enterobacter cloacae complex: NOT DETECTED
Enterobacteriaceae species: NOT DETECTED
Enterococcus species: DETECTED — AB
Escherichia coli: NOT DETECTED
Haemophilus influenzae: NOT DETECTED
Klebsiella oxytoca: NOT DETECTED
Klebsiella pneumoniae: NOT DETECTED
LISTERIA MONOCYTOGENES: NOT DETECTED
NEISSERIA MENINGITIDIS: NOT DETECTED
PSEUDOMONAS AERUGINOSA: NOT DETECTED
Proteus species: NOT DETECTED
STREPTOCOCCUS AGALACTIAE: NOT DETECTED
STREPTOCOCCUS PNEUMONIAE: NOT DETECTED
STREPTOCOCCUS PYOGENES: NOT DETECTED
STREPTOCOCCUS SPECIES: NOT DETECTED
Serratia marcescens: NOT DETECTED
Staphylococcus aureus (BCID): NOT DETECTED
Staphylococcus species: NOT DETECTED
VANCOMYCIN RESISTANCE: NOT DETECTED

## 2018-05-29 LAB — MRSA PCR SCREENING: MRSA by PCR: NEGATIVE

## 2018-05-29 SURGERY — CYSTOSCOPY, WITH STENT INSERTION
Anesthesia: General | Laterality: Right

## 2018-05-29 MED ORDER — ONDANSETRON HCL 4 MG/2ML IJ SOLN: 4.0000 mg | Freq: Four times a day (QID) | INTRAMUSCULAR | Status: DC | PRN

## 2018-05-29 MED ORDER — TRAZODONE HCL 50 MG PO TABS: 25.0000 mg | ORAL_TABLET | Freq: Every evening | ORAL | Status: DC | PRN

## 2018-05-29 MED ORDER — DEXAMETHASONE SODIUM PHOSPHATE 10 MG/ML IJ SOLN
INTRAMUSCULAR | Status: DC | PRN
  Administered 2018-05-29: 10 mg via INTRAVENOUS

## 2018-05-29 MED ORDER — TAMSULOSIN HCL 0.4 MG PO CAPS: 0.4000 mg | ORAL_CAPSULE | Freq: Every day | ORAL | Status: DC

## 2018-05-29 MED ORDER — LIDOCAINE HCL (CARDIAC) PF 100 MG/5ML IV SOSY
PREFILLED_SYRINGE | INTRAVENOUS | Status: DC | PRN
  Administered 2018-05-29: 60 mg via INTRAVENOUS

## 2018-05-29 MED ORDER — BISACODYL 5 MG PO TBEC
5.0000 mg | DELAYED_RELEASE_TABLET | Freq: Every day | ORAL | Status: DC | PRN
  Filled 2018-05-29: qty 1

## 2018-05-29 MED ORDER — HYDROCODONE-ACETAMINOPHEN 5-325 MG PO TABS: 1.0000 | ORAL_TABLET | ORAL | Status: DC | PRN

## 2018-05-29 MED ORDER — POTASSIUM CHLORIDE 10 MEQ/100ML IV SOLN
10.0000 meq | INTRAVENOUS | Status: DC
  Administered 2018-05-29 (×2): 10 meq via INTRAVENOUS
  Filled 2018-05-29 (×5): qty 100

## 2018-05-29 MED ORDER — ACETAMINOPHEN 325 MG PO TABS
ORAL_TABLET | ORAL | Status: AC
  Filled 2018-05-29: qty 2

## 2018-05-29 MED ORDER — SODIUM CHLORIDE 0.9 % IV SOLN
1.0000 g | Freq: Three times a day (TID) | INTRAVENOUS | Status: DC
  Filled 2018-05-29 (×3): qty 1

## 2018-05-29 MED ORDER — PROPOFOL 10 MG/ML IV BOLUS
INTRAVENOUS | Status: AC
  Filled 2018-05-29: qty 20

## 2018-05-29 MED ORDER — SUCCINYLCHOLINE CHLORIDE 20 MG/ML IJ SOLN
INTRAMUSCULAR | Status: DC | PRN
  Administered 2018-05-29: 100 mg via INTRAVENOUS

## 2018-05-29 MED ORDER — PROMETHAZINE HCL 25 MG/ML IJ SOLN: 6.2500 mg | INTRAMUSCULAR | Status: DC | PRN

## 2018-05-29 MED ORDER — FENTANYL CITRATE (PF) 100 MCG/2ML IJ SOLN: 25.0000 ug | INTRAMUSCULAR | Status: DC | PRN

## 2018-05-29 MED ORDER — IOTHALAMATE MEGLUMINE 43 % IV SOLN
INTRAVENOUS | Status: DC | PRN
  Administered 2018-05-29: 5 mL

## 2018-05-29 MED ORDER — ONDANSETRON HCL 4 MG/2ML IJ SOLN
INTRAMUSCULAR | Status: DC | PRN
  Administered 2018-05-29: 4 mg via INTRAVENOUS

## 2018-05-29 MED ORDER — PROPOFOL 10 MG/ML IV BOLUS
INTRAVENOUS | Status: DC | PRN
  Administered 2018-05-29: 150 mg via INTRAVENOUS

## 2018-05-29 MED ORDER — FENTANYL CITRATE (PF) 100 MCG/2ML IJ SOLN
INTRAMUSCULAR | Status: DC | PRN
  Administered 2018-05-29 (×2): 50 ug via INTRAVENOUS

## 2018-05-29 MED ORDER — SODIUM CHLORIDE 0.9 % IV SOLN
1.0000 g | Freq: Two times a day (BID) | INTRAVENOUS | Status: DC
  Administered 2018-05-30: 1 g via INTRAVENOUS
  Filled 2018-05-29 (×2): qty 1

## 2018-05-29 MED ORDER — SODIUM CHLORIDE 0.9 % IV SOLN
1.0000 g | Freq: Once | INTRAVENOUS | Status: AC
  Administered 2018-05-29: 1 g via INTRAVENOUS
  Filled 2018-05-29: qty 1

## 2018-05-29 MED ORDER — ACETAMINOPHEN 650 MG RE SUPP: 650.0000 mg | Freq: Four times a day (QID) | RECTAL | Status: DC | PRN

## 2018-05-29 MED ORDER — ENOXAPARIN SODIUM 40 MG/0.4ML ~~LOC~~ SOLN: 40.0000 mg | SUBCUTANEOUS | Status: DC

## 2018-05-29 MED ORDER — SODIUM CHLORIDE 0.9 % IV SOLN
INTRAVENOUS | Status: DC
  Administered 2018-05-29 – 2018-05-30 (×5): via INTRAVENOUS

## 2018-05-29 MED ORDER — FENTANYL CITRATE (PF) 100 MCG/2ML IJ SOLN
INTRAMUSCULAR | Status: AC
  Filled 2018-05-29: qty 2

## 2018-05-29 MED ORDER — ONDANSETRON HCL 4 MG PO TABS: 4.0000 mg | ORAL_TABLET | Freq: Four times a day (QID) | ORAL | Status: DC | PRN

## 2018-05-29 MED ORDER — DOCUSATE SODIUM 100 MG PO CAPS
100.0000 mg | ORAL_CAPSULE | Freq: Two times a day (BID) | ORAL | Status: DC
  Administered 2018-05-31: 100 mg via ORAL
  Filled 2018-05-29 (×3): qty 1

## 2018-05-29 MED ORDER — PHENYLEPHRINE HCL 10 MG/ML IJ SOLN
INTRAMUSCULAR | Status: AC
  Filled 2018-05-29: qty 1

## 2018-05-29 MED ORDER — ACETAMINOPHEN 325 MG PO TABS
650.0000 mg | ORAL_TABLET | Freq: Four times a day (QID) | ORAL | Status: DC | PRN
  Administered 2018-05-29: 650 mg via ORAL

## 2018-05-29 SURGICAL SUPPLY — 26 items
BAG DRAIN CYSTO-URO LG1000N (MISCELLANEOUS) ×3 IMPLANT
BAG URINE DRAINAGE (UROLOGICAL SUPPLIES) ×3 IMPLANT
BRUSH SCRUB EZ  4% CHG (MISCELLANEOUS)
BRUSH SCRUB EZ 4% CHG (MISCELLANEOUS) IMPLANT
CATH COUDE FOLEY 2W 5CC 18FR (CATHETERS) ×3 IMPLANT
CATH URETL 5X70 OPEN END (CATHETERS) ×3 IMPLANT
CONRAY 43 FOR UROLOGY 50M (MISCELLANEOUS) ×3 IMPLANT
COVER WAND RF STERILE (DRAPES) IMPLANT
GLOVE BIO SURGEON STRL SZ7.5 (GLOVE) ×9 IMPLANT
GOWN STRL REUS W/ TWL LRG LVL3 (GOWN DISPOSABLE) ×2 IMPLANT
GOWN STRL REUS W/ TWL XL LVL3 (GOWN DISPOSABLE) IMPLANT
GOWN STRL REUS W/TWL LRG LVL3 (GOWN DISPOSABLE) ×4
GOWN STRL REUS W/TWL XL LVL3 (GOWN DISPOSABLE)
KIT TURNOVER CYSTO (KITS) ×3 IMPLANT
PACK CYSTO AR (MISCELLANEOUS) ×3 IMPLANT
SENSORWIRE 0.038 NOT ANGLED (WIRE) ×3
SET CYSTO W/LG BORE CLAMP LF (SET/KITS/TRAYS/PACK) ×3 IMPLANT
SOL .9 NS 3000ML IRR  AL (IV SOLUTION) ×2
SOL .9 NS 3000ML IRR UROMATIC (IV SOLUTION) ×1 IMPLANT
STENT URET 6FRX24 CONTOUR (STENTS) IMPLANT
STENT URET 6FRX26 CONTOUR (STENTS) ×3 IMPLANT
SURGILUBE 2OZ TUBE FLIPTOP (MISCELLANEOUS) ×3 IMPLANT
SYR 10ML LL (SYRINGE) ×3 IMPLANT
SYRINGE IRR TOOMEY STRL 70CC (SYRINGE) ×3 IMPLANT
WATER STERILE IRR 1000ML POUR (IV SOLUTION) ×3 IMPLANT
WIRE SENSOR 0.038 NOT ANGLED (WIRE) ×1 IMPLANT

## 2018-05-29 NOTE — Anesthesia Preprocedure Evaluation (Signed)
Anesthesia Evaluation  Patient identified by MRN, date of birth, ID band Patient awake    Reviewed: Allergy & Precautions, H&P , NPO status , Patient's Chart, lab work & pertinent test results, reviewed documented beta blocker date and time   History of Anesthesia Complications Negative for: history of anesthetic complications  Airway Mallampati: II  TM Distance: >3 FB Neck ROM: full    Dental  (+) Dental Advidsory Given, Caps, Missing, Teeth Intact   Pulmonary neg pulmonary ROS,           Cardiovascular Exercise Tolerance: Good negative cardio ROS       Neuro/Psych negative neurological ROS  negative psych ROS   GI/Hepatic negative GI ROS, Neg liver ROS,   Endo/Other  negative endocrine ROS  Renal/GU Renal disease (kidney stone)  negative genitourinary   Musculoskeletal   Abdominal   Peds  Hematology negative hematology ROS (+)   Anesthesia Other Findings Past Medical History: No date: BPH (benign prostatic hyperplasia)   Reproductive/Obstetrics negative OB ROS                             Anesthesia Physical Anesthesia Plan  ASA: II and emergent  Anesthesia Plan: General   Post-op Pain Management:    Induction: Rapid sequence, Intravenous and Cricoid pressure planned  PONV Risk Score and Plan: 2 and Ondansetron and Dexamethasone  Airway Management Planned: Oral ETT  Additional Equipment:   Intra-op Plan:   Post-operative Plan: Extubation in OR  Informed Consent: I have reviewed the patients History and Physical, chart, labs and discussed the procedure including the risks, benefits and alternatives for the proposed anesthesia with the patient or authorized representative who has indicated his/her understanding and acceptance.   Dental Advisory Given  Plan Discussed with: Anesthesiologist, CRNA and Surgeon  Anesthesia Plan Comments:         Anesthesia Quick  Evaluation

## 2018-05-29 NOTE — Progress Notes (Signed)
PHARMACY NOTE:  ANTIMICROBIAL RENAL DOSAGE ADJUSTMENT  Current antimicrobial regimen includes a mismatch between antimicrobial dosage and estimated renal function.  As per policy approved by the Pharmacy & Therapeutics and Medical Executive Committees, the antimicrobial dosage will be adjusted accordingly.  Current antimicrobial dosage:  Meropenem 1 gm IV Q8H   Indication: bacteremia   Renal Function:  Estimated Creatinine Clearance: 47.6 mL/min (A) (by C-G formula based on SCr of 1.51 mg/dL (H)). []      On intermittent HD, scheduled: []      On CRRT    Antimicrobial dosage has been changed to:   Meropenem 1 gm IV Q12H   Additional comments:   Thank you for allowing pharmacy to be a part of this patient's care.  Staphanie Harbison D, [facility] 05/29/2018 4:35 PM

## 2018-05-29 NOTE — Anesthesia Post-op Follow-up Note (Signed)
Anesthesia QCDR form completed.        

## 2018-05-29 NOTE — ED Notes (Signed)
Patient transported to CT 

## 2018-05-29 NOTE — H&P (Signed)
[place] [facility] [place] NAME: Steven Coleman    MR#:  [id number]  DATE OF BIRTH:  01/05/1951  DATE OF ADMISSION:  05/29/2018  PRIMARY CARE PHYSICIAN: Esaw Grandchild, NP   REQUESTING/REFERRING PHYSICIAN: Dr,Johnathan williams  CHIEF COMPLAINT: blood infection   Chief Complaint  Patient presents with  . Pyelonephritis  . Blood Infection    HISTORY OF PRESENT ILLNESS:  Steven Coleman  is a 67 y.o. male with a known history of BPH, urine retention presented to ED yesterday for fever, right flank pain, signed out AMA last night, went home with Bactrim, Omnicef.  Today his blood cultures came back positive so patient is urged to come back.  Patient signed out last night , the ER recommended to stay because of right-sided pyelonephritis with fever.  He received fluids last night, went home with antibiotics comes back because he was called in for positive blood infection.  He denies any fever, nausea or vomiting.  Has right flank pain.  Found to have mild 5 mm stone in the right ureter with mild hydronephrosis, seen by [place] u[facility] and getting emergency right ureteral stone. Declined admission last night. PAST MEDICAL HISTORY:   Past Medical History:  Diagnosis Date  . BPH (benign prostatic hyperplasia)     PAST SURGICAL HISTOIRY:   Past Surgical History:  Procedure Laterality Date  . EYE SURGERY    . 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 prostatic artery embolization    . SHOULDER SURGERY Left 1982  . TONSILLECTOMY AND ADENOIDECTOMY      SOCIAL HISTORY:   Social History   Tobacco Use  . Smoking status: Never Smoker  . Smokeless tobacco: Never Used  Substance Use Topics  . Alcohol use: Yes    Alcohol/week: 1.0 standard drinks    Types: 1 Glasses of wine per week    Comment: weekly    FAMILY HISTORY:   Family History  Problem Relation Age of Onset  . Cancer Mother        lung  . Cancer Father        throat   . Prostate cancer Neg Hx   . Kidney cancer Neg Hx   . Bladder Cancer Neg Hx     DRUG ALLERGIES:  No Known Allergies  REVIEW OF SYSTEMS:  CONSTITUTIONAL: No fever, fatigue or weakness.  EYES: No blurred or double vision.  EARS, NOSE, AND THROAT: No tinnitus or ear pain.  RESPIRATORY: No cough, shortness of breath, wheezing or hemoptysis.  CARDIOVASCULAR: No chest pain, orthopnea, edema.  GASTROINTESTINAL: No nausea, vomiting, diarrhea or abdominal pain.  Complains of right flank pain. GENITOURINARY: No dysuria, hematuria.  ENDOCRINE: No polyuria, nocturia,  HEMATOLOGY: No anemia, easy bruising or bleeding SKIN: No rash or lesion. MUSCULOSKELETAL: No joint pain or arthritis.   NEUROLOGIC: No tingling, numbness, weakness.  PSYCHIATRY: No anxiety or depression.   MEDICATIONS AT HOME:   Prior to Admission medications   Medication Sig Start Date End Date Taking? Authorizing Provider  acetaminophen (TYLENOL) 500 MG tablet Take 1,000 mg by mouth every 6 (six) hours as needed for moderate pain.   Yes [provider]  cefdinir (OMNICEF) 300 MG capsule Take 1 capsule (300 mg total) by mouth 2 (two) times daily. 05/28/18  Yes Carrie Mew, MD  ibuprofen (ADVIL,MOTRIN) 400 MG tablet Take 400 mg by mouth every 6 (six) hours as needed for moderate pain.    Yes [provider]  ondansetron (ZOFRAN ODT) 4 MG disintegrating tablet Take 1 tablet (4 mg total) by mouth every 8 (eight) hours as needed for nausea or vomiting. 05/28/18  Yes Carrie Mew, MD  sulfamethoxazole-trimethoprim (BACTRIM DS) 800-160 MG tablet Take 1 tablet by mouth 2 (two) times daily. 05/28/18  Yes Carrie Mew, MD  tamsulosin (FLOMAX) 0.4 MG CAPS capsule Take 1 capsule (0.4 mg total) by mouth daily. 07/19/17  Yes Festus Aloe, MD      VITAL SIGNS:  Blood pressure 134/73, pulse 91, temperature 98.7 F (37.1 C), temperature source Oral, resp. rate 16, height 5' 9.5" (1.765 m), weight 72.6  kg, SpO2 96 %.  PHYSICAL EXAMINATION:  GENERAL:  67 y.o.-year-old patient lying in the bed with no acute distress.  EYES: Pupils equal, round, reactive to light and accommodation. No scleral icterus. Extraocular muscles intact.  HEENT: Head atraumatic, normocephalic. Oropharynx and nasopharynx clear.  NECK:  Supple, no jugular venous distention. No thyroid enlargement, no tenderness.  LUNGS: Normal breath sounds bilaterally, no wheezing, rales,rhonchi or crepitation. No use of accessory muscles of respiration.  CARDIOVASCULAR: S1, S2 normal. No murmurs, rubs, or gallops.  ABDOMEN: Soft, nontender, nondistended. Bowel sounds present. No organomegaly or mass.  Has right CVA tenderness. EXTREMITIES: No pedal edema, cyanosis, or clubbing.  NEUROLOGIC: Cranial nerves II through XII are intact. Muscle strength 5/5 in all extremities. Sensation intact. Gait not checked.  PSYCHIATRIC: The patient is alert and oriented x 3.  SKIN: No obvious rash, lesion, or ulcer.   LABORATORY PANEL:   CBC Recent Labs  Lab 05/29/18 1443  WBC 22.2*  HGB 13.8  HCT 40.9  PLT 156   ------------------------------------------------------------------------------------------------------------------  Chemistries  Recent Labs  Lab 05/28/18 1658  NA 134*  K 3.2*  CL 101  CO2 22  GLUCOSE 105*  BUN 28*  CREATININE 1.51*  CALCIUM 8.6*  AST 27  ALT 18  ALKPHOS 48  BILITOT 1.3*   ------------------------------------------------------------------------------------------------------------------  Cardiac Enzymes No results for input(s): TROPONINI in the last 168 hours. ------------------------------------------------------------------------------------------------------------------  RADIOLOGY:  Ct Renal Stone Study  Result Date: 05/29/2018 CLINICAL DATA:  Flank pain.  Fever.  Positive blood cultures. EXAM: CT ABDOMEN AND PELVIS WITHOUT CONTRAST TECHNIQUE: Multidetector CT imaging of the abdomen and pelvis  was performed following the standard protocol without IV contrast. COMPARISON:  Renal ultrasound 06/03/2016 FINDINGS: Lower chest: No acute abnormality. Hepatobiliary: No focal liver abnormality is seen. No gallstones, gallbladder wall thickening, or biliary dilatation. Pancreas: Atrophic pancreas without edema or mass Spleen: Negative Adrenals/Urinary Tract: Mild right hydronephrosis. 5 mm calculus in the proximal right ureter causing obstruction. No left renal calculi or hydronephrosis. No other right renal calculi. No renal mass or infarct. Large bladder calculus 17 x 26 mm.  No bladder wall thickening. Stomach/Bowel: Negative for bowel obstruction. No bowel mass or edema. Appendix not well visualized. Vascular/Lymphatic: Atherosclerotic calcification. Negative for aortic aneurysm. No lymphadenopathy Reproductive: Marked prostate enlargement 59 x 64 mm in diameter. Prostate protrudes into the urinary bladder. Other: None Musculoskeletal: No acute skeletal abnormality. Lumbar spine degenerative changes. IMPRESSION: 1. 5 mm stone proximal right ureter causing mild hydronephrosis. 2. Large bladder calculus 17 x 26 mm.  Marked prostate enlargement. 3. Atherosclerotic aorta Electronically Signed   By: Franchot Gallo M.D.   On: 05/29/2018 15:03    EKG:   Orders placed or performed during the hospital encounter of 05/28/18  . EKG 12-Lead  . EKG 12-Lead    IMPRESSION AND PLAN:   #1 .bacteremia with enterococci  in the blood likely coming from right ureteral stone: Admit to medical floor, started on meropenem, IV fluids, keep n.p.o., patient WBC better than yesterday.  Continue to watch, repeat blood cultures. 2.  Right ureteral stone, has 5 mm stone in the proximal right ureter causing mild hydronephrosis, patient also had large bladder calculus and marked. bph; seen by [facility] urology, scheduled for emergency right ureteral stent.  Continue n.p.o., IV fluids,start DVT prophylaxis after surgery. Hold  Flomax before surgery, #3. hypokalemia: Replace the potassium. D/w urology D/w patient All the records are reviewed and case discussed with ED provider. Management plans discussed with the patient, family and they are in agreement.  CODE STATUS: full code  TOTAL TIME TAKING CARE OF THIS PATIENT: 55 minutes.    Epifanio Lesches M.D on 05/29/2018 at 3:43 PM  Between 7am to 6pm - Pager - [phone]  After 6pm go to [email or web address] - password [email or web address]  Office  [phone]  CC: Primary care physician; Esaw Grandchild, NP  Note: This dictation was prepared with Dragon dictation along with smaller phrase technology. Any transcriptional errors that result from this process are unintentional.

## 2018-05-29 NOTE — ED Triage Notes (Addendum)
Pt arrived via POV with spouse with reports of positive blood cultures and was told to return to ED.  Pt was seen last night in ED and dx with pyelonephritis.   Pt denies any fevers, Pt has been taking prescribed antibiotics, and received IV antibiotics last night.  Pt states he is feeling better today.  Per EDP note from last night, pt refused admission.

## 2018-05-29 NOTE — ED Notes (Signed)
Report to Romie Minus on [facility]

## 2018-05-29 NOTE — ED Provider Notes (Signed)
[facility] Emergency Department Provider Note       Time seen: ----------------------------------------- 2:21 PM on 05/29/2018 -----------------------------------------   I have reviewed the triage vital signs and the nursing notes.  HISTORY   Chief Complaint Pyelonephritis and Blood Infection    HPI Steven Coleman is a 67 y.o. male with a history of BPH and urinary retention who presents to the ED for positive blood cultures.  Patient arrives by private vehicle with reports of positive blood cultures.  Patient was seen in the ER last night and diagnosed with pyelonephritis.  Patient was urged to be admitted but he declined admission last night.  Patient continues to have some right-sided low back pain for which she takes Tylenol occasionally.  Currently he is taking Omnicef and Septra DS.  Patient still does not really want to stay in the hospital.  Past Medical History:  Diagnosis Date  . BPH (benign prostatic hyperplasia)     Patient Active Problem List   Diagnosis Date Noted  . Flank pain 05/28/2018  . Fever 05/28/2018  . Abnormal urinalysis 05/28/2018  . Health care maintenance 01/25/2017  . Acute urinary retention 06/04/2016  . Elevated PSA 06/03/2016    Past Surgical History:  Procedure Laterality Date  . EYE SURGERY    . 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 prostatic artery embolization    . SHOULDER SURGERY Left 1982  . TONSILLECTOMY AND ADENOIDECTOMY      Allergies Patient has no known allergies.  Social History Social History   Tobacco Use  . Smoking status: Never Smoker  . Smokeless tobacco: Never Used  Substance Use Topics  . Alcohol use: Yes    Alcohol/week: 1.0 standard drinks    Types: 1 Glasses of wine per week    Comment: weekly  . Drug use: No   Review of Systems Constitutional: Negative for fever. Cardiovascular: Negative for chest pain. Respiratory: Negative for shortness of breath. Gastrointestinal:  Positive for flank pain Genitourinary: Negative for dysuria. Musculoskeletal: Negative for back pain. Skin: Negative for rash. Neurological: Negative for headaches, focal weakness or numbness.  All systems negative/normal/unremarkable except as stated in the HPI  ____________________________________________   PHYSICAL EXAM:  VITAL SIGNS: ED Triage Vitals  Enc Vitals Group     BP 05/29/18 1415 (!) 122/59     Pulse Rate 05/29/18 1415 83     Resp 05/29/18 1415 16     Temp 05/29/18 1415 98.7 F (37.1 C)     Temp Source 05/29/18 1415 Oral     SpO2 05/29/18 1415 97 %     Weight 05/29/18 1416 160 lb (72.6 kg)     Height 05/29/18 1416 5' 9.5" (1.765 m)     Head Circumference --      Peak Flow --      Pain Score 05/29/18 1416 0     Pain Loc --      Pain Edu? --      Excl. in [place]? --    Constitutional: Alert and oriented. Well appearing and in no distress. Eyes: Conjunctivae are normal. Normal extraocular movements. ENT   Head: Normocephalic and atraumatic.   Nose: No congestion/rhinnorhea.   Mouth/Throat: Mucous membranes are moist.   Neck: No stridor. Cardiovascular: Normal rate, regular rhythm. No murmurs, rubs, or gallops. Respiratory: Normal respiratory effort without tachypnea nor retractions. Breath sounds are clear and equal bilaterally. No wheezes/rales/rhonchi. Gastrointestinal: Soft and nontender. Normal bowel sounds Musculoskeletal: Nontender with normal range  of motion in extremities. No lower extremity tenderness nor edema. Neurologic:  Normal speech and language. No gross focal neurologic deficits are appreciated.  Skin:  Skin is warm, dry and intact. No rash noted. Psychiatric: Mood and affect are normal. Speech and behavior are normal.  ____________________________________________  ED COURSE:  As part of my medical decision making, I reviewed the following data within the [facility] History obtained from family if available, nursing  notes, old chart and ekg, as well as notes from prior ED visits. Patient presented for reports of positive blood cultures, we will assess with labs and imaging as indicated at this time. Clinical Course as of May 29 1500  Tue May 29, 2018  1459 Patient does appear to have a kidney stone, still with marked leukocytosis.   [JW]    Clinical Course User Index [JW] Earleen Newport, MD   Procedures ____________________________________________   LABS (pertinent positives/negatives)  Labs Reviewed  CBC WITH DIFFERENTIAL/PLATELET - Abnormal; Notable for the following components:      Result Value   WBC 22.2 (*)    Neutro Abs 19.3 (*)    Monocytes Absolute 1.5 (*)    Abs Immature Granulocytes 0.32 (*)    All other components within normal limits   CRITICAL CARE Performed by: Laurence Aly   Total critical care time: 30 minutes  Critical care time was exclusive of separately billable procedures and treating other patients.  Critical care was necessary to treat or prevent imminent or life-threatening deterioration.  Critical care was time spent personally by me on the following activities: development of treatment plan with patient and/or surrogate as well as nursing, discussions with consultants, evaluation of patient's response to treatment, examination of patient, obtaining history from patient or surrogate, ordering and performing treatments and interventions, ordering and review of laboratory studies, ordering and review of radiographic studies, pulse oximetry and re-evaluation of patient's condition.  RADIOLOGY Images were viewed by me  CT renal protocol Does reveal a proximal right ureteral stone IMPRESSION: 1. 5 mm stone proximal right ureter causing mild hydronephrosis. 2. Large bladder calculus 17 x 26 mm.  Marked prostate enlargement. 3. Atherosclerotic aorta ____________________________________________  DIFFERENTIAL DIAGNOSIS   Renal colic, UTI,  pyelonephritis, sepsis  FINAL ASSESSMENT AND PLAN  Enterococcus bacteremia, proximal right ureteral stone, bladder stone   Plan: The patient had presented for positive blood cultures which did grow out enterococcus. Patient's labs revealed continued leukocytosis. Patient's imaging revealed a proximal right ureteral stone.  I have ordered IV meropenem for him and discussed with urology who will arrange for stent placement this afternoon.  I have discussed with the hospitalist for admission.   Laurence Aly, MD   Note: This note was generated in part or whole with voice recognition software. Voice recognition is usually quite accurate but there are transcription errors that can and very often do occur. I apologize for any typographical errors that were not detected and corrected.     Earleen Newport, MD 05/29/18 [phone]

## 2018-05-29 NOTE — ED Notes (Addendum)
States he feels fine, does not have any symptoms and is here due to a callback for positive blood cultures.

## 2018-05-29 NOTE — Transfer of Care (Signed)
Immediate Anesthesia Transfer of Care Note  Patient: Steven Coleman  Procedure(s) Performed: CYSTOSCOPY WITH STENT PLACEMENT (Right )  Patient Location: PACU  Anesthesia Type:General  Level of Consciousness: awake, alert , oriented and patient cooperative  Airway & Oxygen Therapy: Patient Spontanous Breathing  Post-op Assessment: Report given to RN and Post -op Vital signs reviewed and stable  Post vital signs: Reviewed and stable  Last Vitals:  Vitals Value Taken Time  BP 121/72 05/29/2018  9:40 PM  Temp 36.9 C 05/29/2018  9:40 PM  Pulse 87 05/29/2018  9:42 PM  Resp 16 05/29/2018  9:42 PM  SpO2 98 % 05/29/2018  9:42 PM  Vitals shown include unvalidated device data.  Last Pain:  Vitals:   05/29/18 2140  TempSrc:   PainSc: 0-No pain         Complications: No apparent anesthesia complications

## 2018-05-29 NOTE — Telephone Encounter (Signed)
Spoke with pt's wife who states that pt did not have a fever thru the night and is feeling somewhat better.  Advised pt's wife that if fever reoccurs, he develops nausea or vomiting, chills, or flank pain that he should return to the ED immediately.  Pt's wife expressed understanding.  Charyl Bigger, CMA

## 2018-05-29 NOTE — Op Note (Signed)
Date of procedure: 05/29/18  Preoperative diagnosis:  1. 6 mm right proximal ureteral stone with sepsis, bladder stone  Postoperative diagnosis:  1. Same, and possible small bladder tumor  Procedure: 1. Cystoscopy, right retrograde pyelogram with intraoperative interpretation, right ureteral stent placement  Surgeon: Nickolas Madrid, MD  Anesthesia: General  Complications: None  Intraoperative findings:  1.  Very large prostate with median lobe 2.  Small papillary appearing lesion over right ureteral orifice.  Possible superficial bladder tumor versus irritation from bladder stone 3.  Purulent drainage from right collecting system 4.  2 cm yellow bladder stone 5.  Uncomplicated right ureteral stent placement  EBL: None  Specimens: None  Drains: Right 6 French by 26 cm stent, 18 French coud Foley  Indication: Steven Coleman is a 67 y.o. patient with 6 mm right proximal ureteral stone and hydronephrosis who presented with fever and positive blood cultures.  He was also found to have a 2 cm bladder stone.  After reviewing the management options for treatment, they elected to proceed with the above surgical procedure(s). We have discussed the potential benefits and risks of the procedure, side effects of the proposed treatment, the likelihood of the patient achieving the goals of the procedure, and any potential problems that might occur during the procedure or recuperation. Informed consent has been obtained.  Description of procedure:  The patient was taken to the operating room and general anesthesia was induced.  The patient was placed in the dorsal lithotomy position, prepped and draped in the usual sterile fashion, and preoperative antibiotics(meropenem) were administered.  SCDs were placed for DVT prophylaxis.  A preoperative time-out was performed.   A 21 French rigid cystoscope with a 30 degree lens was used to intubate the urethral meatus.  Normal-appearing urethra was  followed proximally into the bladder.  The prostate was very large with a median lobe.  Inspection of the bladder revealed a 2 cm yellow bladder stone.  There is also a small approximately 1 cm papillary lesion just superior to the right ureteral orifice.  This was concerning for possible superficial bladder cancer, versus irritation from his bladder stone.  This certainly warrants biopsy in follow-up at time of his definitive ureteroscopy.    I was able to identify the right ureteral orifice and this was cannulated with a 5 [place] access catheter.  A sensor wire was advanced alongside the stone into the collecting system.  The access catheter was advanced over the wire and the wire removed.  Purulent drainage was noted from the right collecting system.  5 cc of contrast were injected to aid in stent placement.  The wire was replaced and the access catheter was removed.  A 6 French by 26 cm stent was uneventfully placed over the wire fluoroscopically.  A good curl was noted in the renal pelvis as well as under direct vision in the bladder.  An 35 French coud Foley passed easily into the bladder with return of clear yellow urine.  Disposition: Stable to PACU  Plan: -Admit to hospitalist service, continue broad-spectrum antibiotics, maintain Foley until afebrile 24 hours -Follow-up for cystoscopy, bladder biopsy, cystolitholapaxy, right ureteroscopy, laser lithotripsy, stent exchange in 2 to 3 weeks  Nickolas Madrid, MD 05/29/2018

## 2018-05-29 NOTE — ED Notes (Signed)
Patient transported to room 216

## 2018-05-29 NOTE — Consult Note (Signed)
[place]ch[place] nAME: Steven Coleman  DOB: [birth year]  MRN: [id number]  Date/Time: 05/29/2018 7:15 PM Subjective:  REASON FOR CONSULT: Enterococcus bacteremia ? Steven Coleman is a 67 y.o. male with a history of BPH status post prostate artery embolization in 2018 presents with right flank pain.  Patient was doing all right until Sunday evening when he was about to go to bed he developed pain in the right flank.  He took ibuprofen with not much of relief.  The next day because of the worsening pain and chills  he went to his PCP and was noted to have a temperature of 101.3.  Urine analysis showed blood.  He was asked to go to the nearest emergency room for a diagnosis of pyelonephritis and UTI.  He came to [facility] ED yesterday and had blood and urine culture sent.  He did not want to stay in the hospital.  He was given IV fluids and he was discharged on oral cefdinir and Bactrim.  As the blood culture came back positive as enterococcus he was called back to the hospital and got admitted.  He had a CT scan of the abdomen which showed 5 mm stone proximal right ureter causing mild hydronephrosis.  There was a large bladder calculus of 17 x  26 mm.  Patient said that last night he had to self catheterize at home.  Patient states he continues to have chills. He has been seen by urologist in the planning to do a right ureteral stent this evening.  Past Medical History:  Diagnosis Date  . BPH (benign prostatic hyperplasia)     Past Surgical History:  Procedure Laterality Date  . EYE SURGERY    . 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 prostatic artery embolization    . SHOULDER SURGERY Left 1982  . TONSILLECTOMY AND ADENOIDECTOMY    Social history Lives with his wife Non-smoker 1 glass of wine a day He has a farm with  chickens, cats and dogs. Family History  Problem Relation Age of Onset  . Cancer Mother        lung  . Cancer Father        throat  . Prostate cancer Neg Hx   . Kidney cancer Neg Hx    . Bladder Cancer Neg Hx    No Known Allergies ? Current Facility-Administered Medications  Medication Dose Route Frequency Provider Last Rate Last Dose  . 0.9 %  sodium chloride infusion   Intravenous Continuous Epifanio Lesches, MD 125 mL/hr at 05/29/18 1844    . acetaminophen (TYLENOL) tablet 650 mg  650 mg Oral Q6H PRN Epifanio Lesches, MD       Or  . acetaminophen (TYLENOL) suppository 650 mg  650 mg Rectal Q6H PRN Epifanio Lesches, MD      . bisacodyl (DULCOLAX) EC tablet 5 mg  5 mg Oral Daily PRN Epifanio Lesches, MD      . docusate sodium (COLACE) capsule 100 mg  100 mg Oral BID Epifanio Lesches, MD      . HYDROcodone-acetaminophen (NORCO/VICODIN) 5-325 MG per tablet 1-2 tablet  1-2 tablet Oral Q4H PRN Epifanio Lesches, MD      . Derrill Memo ON 05/30/2018] meropenem (MERREM) 1 g in sodium chloride 0.9 % 100 mL IVPB  1 g Intravenous Q12H Epifanio Lesches, MD      . ondansetron (ZOFRAN) tablet 4 mg  4 mg Oral Q6H PRN Epifanio Lesches, MD       Or  .  ondansetron (ZOFRAN) injection 4 mg  4 mg Intravenous Q6H PRN Epifanio Lesches, MD      . potassium chloride 10 mEq in 100 mL IVPB  10 mEq Intravenous Q1 Hr x 4 Epifanio Lesches, MD 80 mL/hr at 05/29/18 1844    . traZODone (DESYREL) tablet 25 mg  25 mg Oral QHS PRN Epifanio Lesches, MD         Abtx:  Anti-infectives (From admission, onward)   Start     Dose/Rate Route Frequency Ordered Stop   05/30/18 0300  meropenem (MERREM) 1 g in sodium chloride 0.9 % 100 mL IVPB     1 g 200 mL/hr over 30 Minutes Intravenous Every 12 hours 05/29/18 1635     05/29/18 1545  meropenem (MERREM) 1 g in sodium chloride 0.9 % 100 mL IVPB  Status:  Discontinued     1 g 200 mL/hr over 30 Minutes Intravenous Every 8 hours 05/29/18 1532 05/29/18 1635   05/29/18 1500  meropenem (MERREM) 1 g in sodium chloride 0.9 % 100 mL IVPB     1 g 200 mL/hr over 30 Minutes Intravenous  Once 05/29/18 1500 05/29/18 1551      REVIEW  OF SYSTEMS:  Const:  fever, chills, negative weight loss Eyes: negative diplopia or visual changes, negative eye pain ENT: negative coryza, negative sore throat Resp: negative cough, hemoptysis, dyspnea Cards: negative for chest pain, palpitations, lower extremity edema GU: difficulty in emptying bladder, frequency, dysuria and hematuria, rt flank pain GI: Negative for abdominal pain, no diarrhea, bleeding, constipation Skin: negative for rash and pruritus Heme: negative for easy bruising and gum/nose bleeding MS: negative for myalgias, arthralgias, back pain and muscle weakness Neurolo:negative for headaches, dizziness, vertigo, memory problems  Psych: negative for feelings of anxiety, depression  Endocrine: No polyuria or polydipsia Allergy/Immunology- negative for any medication or food allergies ? Pertinent Positives include : Objective:  VITALS:  BP 133/80 (BP Location: Right Arm)   Pulse 79   Temp 98.2 F (36.8 C) (Oral)   Resp 17   Ht 5' 9.5" (1.765 m)   Wt 72.6 kg   SpO2 100%   BMI 23.29 kg/m  PHYSICAL EXAM:  General: Alert, cooperative, no distress, appears stated age.  Head: Normocephalic, without obvious abnormality, atraumatic. Eyes: Conjunctivae clear, anicteric sclerae. Pupils are equal, fasciculations above  the left eyebrow ENT Nares normal. No drainage or sinus tenderness. Lips, mucosa, and tongue normal. No Thrush Neck: Supple, symmetrical, no adenopathy, thyroid: non tender no carotid bruit and no JVD. Back: rt  CVA tenderness. Lungs: Clear to auscultation bilaterally. No Wheezing or Rhonchi. No rales. Heart: Regular rate and rhythm, no murmur, rub or gallop. Abdomen: Soft, non-tender,not distended. Bowel sounds normal. No masses Extremities: atraumatic, no cyanosis. No edema. No clubbing Skin: No rashes or lesions. Or bruising Lymph: Cervical, supraclavicular normal. Neurologic: Grossly non-focal Pertinent Labs CBC Latest Ref Rng & Units 05/29/2018  05/28/2018 01/25/2017  WBC 4.0 - 10.5 K/uL 22.2(H) 24.2(H) 6.0  Hemoglobin 13.0 - 17.0 g/dL 13.8 14.7 15.8  Hematocrit 39.0 - 52.0 % 40.9 45.4 48.0  Platelets 150 - 400 K/uL 156 183 251   CMP Latest Ref Rng & Units 05/28/2018 01/25/2017 06/04/2016  Glucose 70 - 99 mg/dL 105(H) 105(H) 105(H)  BUN 8 - 23 mg/dL 28(H) 21 40(H)  Creatinine 0.61 - 1.24 mg/dL 1.51(H) 0.77 1.44(H)  Sodium 135 - 145 mmol/L 134(L) 143 143  Potassium 3.5 - 5.1 mmol/L 3.2(L) 5.2 4.3  Chloride 98 - 111 mmol/L  101 102 113(H)  CO2 22 - 32 mmol/L 22 26 25   Calcium 8.9 - 10.3 mg/dL 8.6(L) 10.0 8.3(L)  Total Protein 6.5 - 8.1 g/dL 6.7 7.2 -  Total Bilirubin 0.3 - 1.2 mg/dL 1.3(H) 0.9 -  Alkaline Phos 38 - 126 U/L 48 68 -  AST 15 - 41 U/L 27 24 -  ALT 0 - 44 U/L 18 21 -   IMAGING RESULTS: CT renal ?5 mm stone proximal right ureter causing mild hydronephrosis. 2. Large bladder calculus 17 x 26 mm.  Marked prostate enlargement. 3. Atherosclerotic aorta  Impression/Recommendation ?67 y.o. male with a history of BPH status post prostate artery embolization in 2018 presents with right flank pain.  Patient was doing all right until Sunday evening when he was about to go to bed he developed pain in the right flank.  He took ibuprofen with not much of relief.  The next day because of the worsening pain and chills  he went to his PCP and was noted to have a temperature of 101.3.  Urine analysis showed blood.  He was asked to go to the nearest emergency room for a diagnosis of pyelonephritis and UTI.  ? ?Enterococcus bacteremia with urosepsis.  Secondary to right hydronephrosis because of renal stone. The urine culture is still pending.  Enterococcus susceptibility is still not available.  He is currently on meropenem.  Does not need meropenem can be given   ampicillin instead.  Right hydronephrosis secondary to a 5 mm stone.  Urology has seen him and he is being taken for cystoscopy and stent placement.  AKI secondary to infection,  blockage.  Severe BPH.  Underwent prostate artery embolization in 2018 with some relief.  Takes tamsulosin. ___________________________________________________ Discussed with patient and his wife

## 2018-05-29 NOTE — ED Notes (Signed)
ED Provider at bedside. 

## 2018-05-29 NOTE — Telephone Encounter (Signed)
Called patient due to positive blood culture.  Recommended patient to return to ED.  He agrees to return.

## 2018-05-29 NOTE — Consult Note (Addendum)
3:40 PM   Steven Coleman Sep 25, 1950 [id number]  CC: Fever, positive blood cultures and right ureteral stone  HPI: I was asked to see Steven Coleman in consultation for fever, positive blood cultures, and 35mm right ureteral stone.  He is a 67 year old male with urologic history notable for urinary retention status post prostatic artery embolization at [facility] in March 2018.  He was seen in the emergency department yesterday with fever to 101 and blood cultures were drawn and he was discharged with antibiotics for presumed pyelonephritis.  Blood cultures returned positive for enterococcus, and he was called and instructed to return to the ED by the emergency department today.  A CT scan today showed a 5 mm right proximal ureteral stone with mild upstream hydronephrosis.  Urinalysis yesterday showed more than 50 WBCs, white blood cell clumps, rare bacteria, nitrite positive.  Leukocytosis to 24K.  He is having some mild right-sided flank pain and some general malaise.  Last oral intake was a piece of lunch meat around noon today.   PMH: Past Medical History:  Diagnosis Date  . BPH (benign prostatic hyperplasia)     Surgical History: Past Surgical History:  Procedure Laterality Date  . EYE SURGERY    . 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 prostatic artery embolization    . SHOULDER SURGERY Left 1982  . TONSILLECTOMY AND ADENOIDECTOMY       Allergies: No Known Allergies  Family History: Family History  Problem Relation Age of Onset  . Cancer Mother        lung  . Cancer Father        throat  . Prostate cancer Neg Hx   . Kidney cancer Neg Hx   . Bladder Cancer Neg Hx     Social History:  reports that he has never smoked. He has never used smokeless tobacco. He reports that he drinks about 1.0 standard drinks of alcohol per week. He reports that he does not use drugs.  ROS: Please see flowsheet from today's date for complete review of systems.  Physical Exam: BP 134/73    Pulse 91   Temp 98.7 F (37.1 C) (Oral)   Resp 16   Ht 5' 9.5" (1.765 m)   Wt 72.6 kg   SpO2 96%   BMI 23.29 kg/m    Constitutional:  Alert and oriented, No acute distress. Cardiovascular: RRR Respiratory: CTA bilaterally GI: Abdomen is soft, nontender, nondistended, no abdominal masses Lymph: No cervical or inguinal lymphadenopathy. Skin: No rashes, bruises or suspicious lesions. Neurologic: Grossly intact, no focal deficits, moving all 4 extremities. Psychiatric: Normal mood and affect.  Laboratory Data: Personally reviewed  Creatinine 1.5 WBC 24.2  Pertinent Imaging: I have personally reviewed the CT stone protocol dated 05/29/2018: Markedly enlarged prostate, right 5 mm proximal ureteral stone with upstream hydronephrosis, 2 cm bladder stone.  Assessment & Plan:   In summary, Steven Coleman is a 67 year old male with history of prostatic artery artery embolization 18 months ago at [facility] who presents with right-sided flank pain, fever to 101, positive blood cultures for enterococcus, and a 5 mm right proximal ureteral stone with upstream hydronephrosis.  We discussed the importance of urgent decompression of an infected, obstructed right collecting system.  We will plan on urgent cystoscopy with right ureteral stent placement.  We discussed there is a higher rate of failure with this procedure secondary to his significantly enlarged prostate.  If we are unable to place a stent in retrograde  fashion, we will consult interventional radiology for a right-sided nephrostomy tube tonight.  I discussed the need for a Foley postoperatively until he remains afebrile for greater than 24 hours.  He will be admitted to hospitalist service for broad-spectrum antibiotics and resuscitation.  Finally, we discussed the need for definitive management of the stone in 2 to 3 weeks when his infection is been treated.  We will plan for right ureteroscopy, laser lithotripsy, and stent exchange, as well as  treat his bladder stone at that time.  Informed consent obtained, right side marked.  Steven Coleman, [facility] [address], [place], [place] [address] [phone]

## 2018-05-29 NOTE — Anesthesia Procedure Notes (Signed)
Procedure Name: Intubation Date/Time: 05/29/2018 9:12 PM Performed by: Lendon Colonel, CRNA Pre-anesthesia Checklist: Patient identified, Patient being monitored, Timeout performed, Emergency Drugs available and Suction available Patient Re-evaluated:Patient Re-evaluated prior to induction Oxygen Delivery Method: Circle system utilized Preoxygenation: Pre-oxygenation with 100% oxygen Induction Type: IV induction, Rapid sequence and Cricoid Pressure applied Laryngoscope Size: McGraph and 3 Grade View: Grade I Tube type: Oral Tube size: 7.5 mm Number of attempts: 2 Airway Equipment and Method: Stylet Placement Confirmation: ETT inserted through vocal cords under direct vision,  positive ETCO2 and breath sounds checked- equal and bilateral Secured at: 21 cm Tube secured with: Tape Dental Injury: Teeth and Oropharynx as per pre-operative assessment  Difficulty Due To: Difficult Airway- due to anterior larynx

## 2018-05-30 ENCOUNTER — Encounter: Payer: Self-pay | Admitting: Urology

## 2018-05-30 LAB — GLUCOSE, CAPILLARY: Glucose-Capillary: 116 mg/dL — ABNORMAL HIGH (ref 70–99)

## 2018-05-30 LAB — CBC
HEMATOCRIT: 39.4 % (ref 39.0–52.0)
HEMOGLOBIN: 13 g/dL (ref 13.0–17.0)
MCH: 30.4 pg (ref 26.0–34.0)
MCHC: 33 g/dL (ref 30.0–36.0)
MCV: 92.3 fL (ref 80.0–100.0)
Platelets: 141 10*3/uL — ABNORMAL LOW (ref 150–400)
RBC: 4.27 MIL/uL (ref 4.22–5.81)
RDW: 13.9 % (ref 11.5–15.5)
WBC: 14.6 10*3/uL — ABNORMAL HIGH (ref 4.0–10.5)
nRBC: 0 % (ref 0.0–0.2)

## 2018-05-30 LAB — HIV ANTIBODY (ROUTINE TESTING W REFLEX): HIV Screen 4th Generation wRfx: NONREACTIVE

## 2018-05-30 LAB — BASIC METABOLIC PANEL
ANION GAP: 9 (ref 5–15)
BUN: 20 mg/dL (ref 8–23)
CHLORIDE: 107 mmol/L (ref 98–111)
CO2: 22 mmol/L (ref 22–32)
Calcium: 7.8 mg/dL — ABNORMAL LOW (ref 8.9–10.3)
Creatinine, Ser: 1.07 mg/dL (ref 0.61–1.24)
GFR calc Af Amer: >60 mL/min (ref >60)
GFR calc non Af Amer: >60 mL/min (ref >60)
GLUCOSE: 145 mg/dL — AB (ref 70–99)
POTASSIUM: 4.6 mmol/L (ref 3.5–5.1)
Sodium: 138 mmol/L (ref 135–145)

## 2018-05-30 MED ORDER — TAMSULOSIN HCL 0.4 MG PO CAPS
0.4000 mg | ORAL_CAPSULE | Freq: Every day | ORAL | Status: DC
  Administered 2018-05-30 – 2018-05-31 (×2): 0.4 mg via ORAL
  Filled 2018-05-30 (×2): qty 1

## 2018-05-30 MED ORDER — SODIUM CHLORIDE 0.9 % IV SOLN
2.0000 g | Freq: Four times a day (QID) | INTRAVENOUS | Status: DC
  Administered 2018-05-30 – 2018-05-31 (×5): 2 g via INTRAVENOUS
  Filled 2018-05-30: qty 2000
  Filled 2018-05-30 (×2): qty 2
  Filled 2018-05-30 (×4): qty 2000

## 2018-05-30 NOTE — Progress Notes (Signed)
[facility] [place] NAME: Dream Nodal    MR#:  [id number]  DATE OF BIRTH:  Apr 12, 1951  SUBJECTIVE:  CHIEF COMPLAINT:   Chief Complaint  Patient presents with  . Pyelonephritis  . Blood Infection  Patient seen and evaluated today Has decreased flank pain No fever No nausea and vomiting  REVIEW OF SYSTEMS:    ROS  CONSTITUTIONAL: No documented fever. No fatigue, weakness. No weight gain, no weight loss.  EYES: No blurry or double vision.  ENT: No tinnitus. No postnasal drip. No redness of the oropharynx.  RESPIRATORY: No cough, no wheeze, no hemoptysis. No dyspnea.  CARDIOVASCULAR: No chest pain. No orthopnea. No palpitations. No syncope.  GASTROINTESTINAL: No nausea, no vomiting or diarrhea. No abdominal pain. No melena or hematochezia.  Decreased flank pain GENITOURINARY: No dysuria or hematuria.  ENDOCRINE: No polyuria or nocturia. No heat or cold intolerance.  HEMATOLOGY: No anemia. No bruising. No bleeding.  INTEGUMENTARY: No rashes. No lesions.  MUSCULOSKELETAL: No arthritis. No swelling. No gout.  NEUROLOGIC: No numbness, tingling, or ataxia. No seizure-type activity.  PSYCHIATRIC: No anxiety. No insomnia. No ADD.   DRUG ALLERGIES:  No Known Allergies  VITALS:  Blood pressure 126/73, pulse 70, temperature 97.6 F (36.4 C), temperature source Oral, resp. rate 16, height 5' 9.5" (1.765 m), weight 74.2 kg, SpO2 98 %.  PHYSICAL EXAMINATION:   Physical Exam  GENERAL:  67 y.o.-year-old patient lying in the bed with no acute distress.  EYES: Pupils equal, round, reactive to light and accommodation. No scleral icterus. Extraocular muscles intact.  HEENT: Head atraumatic, normocephalic. Oropharynx and nasopharynx clear.  NECK:  Supple, no jugular venous distention. No thyroid enlargement, no tenderness.  LUNGS: Normal breath sounds bilaterally, no wheezing, rales, rhonchi. No use of accessory muscles of respiration.   CARDIOVASCULAR: S1, S2 normal. No murmurs, rubs, or gallops.  ABDOMEN: Soft, nontender, nondistended. Bowel sounds present. No organomegaly or mass.  Decreased flank pain EXTREMITIES: No cyanosis, clubbing or edema b/l.    NEUROLOGIC: Cranial nerves II through XII are intact. No focal Motor or sensory deficits b/l.   PSYCHIATRIC: The patient is alert and oriented x 3.  SKIN: No obvious rash, lesion, or ulcer.   LABORATORY PANEL:   CBC Recent Labs  Lab 05/30/18 0454  WBC 14.6*  HGB 13.0  HCT 39.4  PLT 141*   ------------------------------------------------------------------------------------------------------------------ Chemistries  Recent Labs  Lab 05/28/18 1658 05/30/18 0454  NA 134* 138  K 3.2* 4.6  CL 101 107  CO2 22 22  GLUCOSE 105* 145*  BUN 28* 20  CREATININE 1.51* 1.07  CALCIUM 8.6* 7.8*  AST 27  --   ALT 18  --   ALKPHOS 48  --   BILITOT 1.3*  --    ------------------------------------------------------------------------------------------------------------------  Cardiac Enzymes No results for input(s): TROPONINI in the last 168 hours. ------------------------------------------------------------------------------------------------------------------  RADIOLOGY:  Ct Renal Stone Study  Result Date: 05/29/2018 CLINICAL DATA:  Flank pain.  Fever.  Positive blood cultures. EXAM: CT ABDOMEN AND PELVIS WITHOUT CONTRAST TECHNIQUE: Multidetector CT imaging of the abdomen and pelvis was performed following the standard protocol without IV contrast. COMPARISON:  Renal ultrasound 06/03/2016 FINDINGS: Lower chest: No acute abnormality. Hepatobiliary: No focal liver abnormality is seen. No gallstones, gallbladder wall thickening, or biliary dilatation. Pancreas: Atrophic pancreas without edema or mass Spleen: Negative Adrenals/Urinary Tract: Mild right hydronephrosis. 5 mm calculus in the proximal right ureter causing obstruction. No left renal calculi or hydronephrosis. No  other right renal calculi. No renal mass or infarct. Large bladder calculus 17 x 26 mm.  No bladder wall thickening. Stomach/Bowel: Negative for bowel obstruction. No bowel mass or edema. Appendix not well visualized. Vascular/Lymphatic: Atherosclerotic calcification. Negative for aortic aneurysm. No lymphadenopathy Reproductive: Marked prostate enlargement 59 x 64 mm in diameter. Prostate protrudes into the urinary bladder. Other: None Musculoskeletal: No acute skeletal abnormality. Lumbar spine degenerative changes. IMPRESSION: 1. 5 mm stone proximal right ureter causing mild hydronephrosis. 2. Large bladder calculus 17 x 26 mm.  Marked prostate enlargement. 3. Atherosclerotic aorta Electronically Signed   By: Franchot Gallo M.D.   On: 05/29/2018 15:03     ASSESSMENT AND PLAN:  67 year old male patient with history of prostate hypertrophy currently under hospitalist service for flank pain and fever  -Enterococcal bacteremia and urinary tract infection Discussed with pharmacy and patient started on IV ampicillin antibiotic Follow-up culture sensitivities  -Right ureteral stone Status post cystoscopy by urology and ureteral stent placement Foley catheter to continue IV fluids  -Hypokalemia Improved potassium has been replaced  -Acute pyelonephritis secondary to ureteral stone Improving with IV antibiotics  -Leukocytosis resolving   All the records are reviewed and case discussed with Care Management/Social Worker. Management plans discussed with the patient, family and they are in agreement.  CODE STATUS: Full code  DVT Prophylaxis: SCDs  TOTAL TIME TAKING CARE OF THIS PATIENT: 34 minutes.   POSSIBLE D/C IN 1 to 2 DAYS, DEPENDING ON CLINICAL CONDITION.  Saundra Shelling M.D on 05/30/2018 at 12:05 PM  Between 7am to 6pm - Pager - [phone]  After 6pm go to [email or web address] - password [email or web address] [facility]  Office  [phone]  CC: Primary care physician;  Esaw Grandchild, NP  Note: This dictation was prepared with Dragon dictation along with smaller phrase technology. Any transcriptional errors that result from this process are unintentional.

## 2018-05-30 NOTE — Anesthesia Postprocedure Evaluation (Signed)
Anesthesia Post Note  Patient: Steven Coleman  Procedure(s) Performed: CYSTOSCOPY WITH STENT PLACEMENT (Right )  Patient location during evaluation: PACU Anesthesia Type: General Level of consciousness: awake and alert Pain management: pain level controlled Vital Signs Assessment: post-procedure vital signs reviewed and stable Respiratory status: spontaneous breathing, nonlabored ventilation, respiratory function stable and patient connected to nasal cannula oxygen Cardiovascular status: blood pressure returned to baseline and stable Postop Assessment: no apparent nausea or vomiting Anesthetic complications: no     Last Vitals:  Vitals:   05/29/18 2313 05/30/18 0019  BP: 125/73 125/73  Pulse: 75 78  Resp: 16 20  Temp: 36.8 C 36.9 C  SpO2: 95% 96%    Last Pain:  Vitals:   05/30/18 0048  TempSrc:   PainSc: 0-No pain                 Martha Clan

## 2018-05-30 NOTE — Progress Notes (Signed)
   05/30/18 1400  Clinical Encounter Type  Visited With Patient  Visit Type Initial  Recommendations Follow-up, as requested.  Spiritual Encounters  Spiritual Needs Emotional (Spiritual but not religious)   Patient is eager to discuss social issues, his garden/farm, children, and past as a Engineering geologist. He is spiritual-but-not-religious and an ex-Catholic. Chaplain provided active listening, reflective responses, and pastoral presence.

## 2018-05-30 NOTE — Progress Notes (Signed)
Advanced care plan.  Purpose of the Encounter: CODE STATUS  Parties in Attendance: Patient  Patient's Decision Capacity: Good  Subjective/Patient's story: Presented to emergency room for fever, flank pain   Objective/Medical story Patient has bacteremia and pyelonephritis Needs urology evaluation and intervention and IV antibiotics   Goals of care determination:  Advance care directives goals of care discussed Patient wants everything done which includes CPR, intubation ventilator if the need arises  CODE STATUS: Full code   Time spent discussing advanced care planning: 16 minutes

## 2018-05-30 NOTE — Progress Notes (Signed)
UROLOGY PROGRESS NOTE  Afebrile, VSS Denies abdominal/flank pain  Alert, NAD, conversational Abdomen soft Urine clear yellow per foley  WBC 14.6(22) Cr 1.07(1.5)  A/P: Steven Coleman is a 67 year old male with fever, bacteremia, right proximal ureteral stone and bladder stone post-op day #1 cystoscopy, right ureteral stent placement. Clinically doing well, labs improving.  Continue broad spectrum abx, narrow as able pending cultures, recommend 14 day duration Maintain foley until afebrile >24 hours We will arrange follow up for definitive management of ureteral and bladder stone in 2-3 weeks when infection treated Call if questions  Steven Madrid, MD 05/30/2018

## 2018-05-30 NOTE — Progress Notes (Signed)
ichard THORNTON DOHRMANN is a 67 y.o. male with a history of BPH status post prostate artery embolization in 2018 presents with right flank pain.  Patient was doing all right until Sunday evening when he was about to go to bed he developed pain in the right flank.  He took ibuprofen with not much of relief.  The next day because of the worsening pain and chills  he went to his PCP and was noted to have a temperature of 101.3.  Urine analysis showed blood.  He was asked to go to the nearest emergency room for a diagnosis of pyelonephritis and UTI.  He came to [facility] ED yesterday and had blood and urine culture sent.  He did not want to stay in the hospital.  He was given IV fluids and he was discharged on oral cefdinir and Bactrim.  As the blood culture came back positive as enterococcus he was called back to the hospital and got admitted.  He had a CT scan of the abdomen which showed 5 mm stone proximal right ureter causing mild hydronephrosis.  There was a large bladder calculus of 17 x  26 mm.  Patient said that last night he had to self catheterize at home.  Patient states he continues to have chills. He has been seen by urologist in the planning to do a right ureteral stent this evening.  Pt doing better Had stent placement yesterday No pain or fever or chills  Objective:  VITALS:  Patient Vitals for the past 24 hrs:  BP Temp Temp src Pulse Resp SpO2 Height Weight  05/30/18 1146 126/73 97.6 F (36.4 C) Oral 70 16 98 % - -  05/30/18 1017 - - - - - - - 74.2 kg  05/30/18 0735 (!) 143/88 (!) 97.5 F (36.4 C) Oral 70 16 98 % - -  05/30/18 0446 124/72 (!) 97.5 F (36.4 C) Oral 64 16 98 % - 47.2 kg  05/30/18 0019 125/73 98.4 F (36.9 C) Oral 78 20 96 % - -  05/29/18 2313 125/73 98.3 F (36.8 C) Oral 75 16 95 % - -  05/29/18 2229 118/73 98.3 F (36.8 C) Oral 79 (!) 24 98 % - -  05/29/18 2220 133/76 - - 79 19 98 % - -  05/29/18 2215 - - - 80 18 99 % - -  05/29/18 2210 125/73 97.9 F (36.6 C) - 76 (!) 21  98 % - -  05/29/18 2205 - - - 79 20 99 % - -  05/29/18 2200 123/78 - - 79 20 98 % - -  05/29/18 2155 - - - 81 18 98 % - -  05/29/18 2150 124/72 - - 83 14 96 % - -  05/29/18 2145 - - - 88 14 98 % - -  05/29/18 2140 121/72 98.5 F (36.9 C) - 89 (!) 24 97 % - -  05/29/18 1633 133/80 98.2 F (36.8 C) Oral 79 - 100 % - -  05/29/18 1530 140/77 98 F (36.7 C) Oral 83 17 96 % - -  05/29/18 1517 134/73 - - 91 - 96 % - -  05/29/18 1416 - - - - - - 5' 9.5" (1.765 m) 72.6 kg  05/29/18 1415 (!) 122/59 98.7 F (37.1 C) Oral 83 16 97 % - -   PHYSICAL EXAM:  General: Alert, cooperative, no distress, appears stated age.  Head: Normocephalic, without obvious abnormality, atraumatic. Eyes: Conjunctivae clear, anicteric sclerae. Pupils are equal, fasciculations above  the left eyebrow ENT Nares normal. No drainage or sinus tenderness. Lips, mucosa, and tongue normal. No Thrush Neck: Supple, symmetrical, no adenopathy, thyroid: non tender no carotid bruit and no JVD. Back: rt  CVA tenderness. Lungs: Clear to auscultation bilaterally. No Wheezing or Rhonchi. No rales. Heart: Regular rate and rhythm, no murmur, rub or gallop. Abdomen: Soft, non-tender,not distended. Bowel sounds normal. No masses Extremities: atraumatic, no cyanosis. No edema. No clubbing Skin: No rashes or lesions. Or bruising Lymph: Cervical, supraclavicular normal. Neurologic: Grossly non-focal Foley -clear urine Pertinent Labs CBC Latest Ref Rng & Units 05/30/2018 05/29/2018 05/28/2018  WBC 4.0 - 10.5 K/uL 14.6(H) 22.2(H) 24.2(H)  Hemoglobin 13.0 - 17.0 g/dL 13.0 13.8 14.7  Hematocrit 39.0 - 52.0 % 39.4 40.9 45.4  Platelets 150 - 400 K/uL 141(L) 156 183   CMP Latest Ref Rng & Units 05/30/2018 05/28/2018 01/25/2017  Glucose 70 - 99 mg/dL 145(H) 105(H) 105(H)  BUN 8 - 23 mg/dL 20 28(H) 21  Creatinine 0.61 - 1.24 mg/dL 1.07 1.51(H) 0.77  Sodium 135 - 145 mmol/L 138 134(L) 143  Potassium 3.5 - 5.1 mmol/L 4.6 3.2(L) 5.2  Chloride 98  - 111 mmol/L 107 101 102  CO2 22 - 32 mmol/L 22 22 26   Calcium 8.9 - 10.3 mg/dL 7.8(L) 8.6(L) 10.0  Total Protein 6.5 - 8.1 g/dL - 6.7 7.2  Total Bilirubin 0.3 - 1.2 mg/dL - 1.3(H) 0.9  Alkaline Phos 38 - 126 U/L - 48 68  AST 15 - 41 U/L - 27 24  ALT 0 - 44 U/L - 18 21   IMAGING RESULTS: CT renal ?5 mm stone proximal right ureter causing mild hydronephrosis. 2. Large bladder calculus 17 x 26 mm. Marked prostate enlargement. 3. Atherosclerotic aorta  Impression/Recommendation ?67 y.o. male with a history of BPH status post prostate artery embolization in 2018 presents with right flank pain.  Patient was doing all right until Sunday evening when he was about to go to bed he developed pain in the right flank.  He took ibuprofen with not much of relief.  The next day because of the worsening pain and chills  he went to his PCP and was noted to have a temperature of 101.3.  Urine analysis showed blood.  He was asked to go to the nearest emergency room for a diagnosis of pyelonephritis and UTI.  ? ?Enterococcus bacteremia with urosepsis.  Secondary to right hydronephrosis because of renal stone. S/p stent , much improved- leucocytosis much better The urine culture had multiple organisms but I spoke to lab and they see 60K colonies of enterococcus .  Enterococcus susceptibility is still not available.  meropenem changed to ampicillin No risk for endocarditis  Right hydronephrosis secondary to a 5 mm stone.  s/p stent placement Cystoscopy revealed possible bladder mass and bladder stone as well AKI secondary to infection, blockage.  Severe BPH.  Underwent prostate artery embolization in 2018 with some relief.  Takes tamsulosin. Foley  since procedure PT wants to go home. Explained to him that the susceptibility is not back but as he is doing well on current antibiotics( meropenem and then ampicillin) I expect the enterococcus to be susceptible to ampicillin so he could be discharged on the  same when ready. Discussed the management with him and his wife and Dr.Pyreddy. I will be away until 10/15.text if needed Treatment plan entered-

## 2018-05-30 NOTE — Treatment Plan (Signed)
Diagnosis: Enterococcus bacteremia with pyelonephritis Baseline Creatinine 1.07   No Known Allergies  OPAT Orders Discharge antibiotics: Ampicillin 2 grams IV every 6 hours  Or  can be given as a continuous infusion of 8 grams every 24 hrs as per patient convenience  End date 06/12/2018  [facility] Care Per Protocol:  Labs weekly while on IV antibiotics: _X_ CBC with differential  _X_ CMP   _X_ Please pull PIC at completion of IV antibiotics  Fax weekly labs to [phone] Call with any questions or critical value  [phone]

## 2018-05-31 ENCOUNTER — Inpatient Hospital Stay: Payer: Self-pay

## 2018-05-31 LAB — BASIC METABOLIC PANEL
ANION GAP: 7 (ref 5–15)
BUN: 21 mg/dL (ref 8–23)
CHLORIDE: 107 mmol/L (ref 98–111)
CO2: 25 mmol/L (ref 22–32)
Calcium: 8.3 mg/dL — ABNORMAL LOW (ref 8.9–10.3)
Creatinine, Ser: 1.01 mg/dL (ref 0.61–1.24)
GFR calc Af Amer: >60 mL/min (ref >60)
GFR calc non Af Amer: >60 mL/min (ref >60)
Glucose, Bld: 111 mg/dL — ABNORMAL HIGH (ref 70–99)
Potassium: 4 mmol/L (ref 3.5–5.1)
SODIUM: 139 mmol/L (ref 135–145)

## 2018-05-31 LAB — CBC
HCT: 41.2 % (ref 39.0–52.0)
Hemoglobin: 13.5 g/dL (ref 13.0–17.0)
MCH: 29.9 pg (ref 26.0–34.0)
MCHC: 32.8 g/dL (ref 30.0–36.0)
MCV: 91.2 fL (ref 80.0–100.0)
NRBC: 0 % (ref 0.0–0.2)
PLATELETS: 172 10*3/uL (ref 150–400)
RBC: 4.52 MIL/uL (ref 4.22–5.81)
RDW: 13.9 % (ref 11.5–15.5)
WBC: 15.3 10*3/uL — ABNORMAL HIGH (ref 4.0–10.5)

## 2018-05-31 LAB — GLUCOSE, CAPILLARY: GLUCOSE-CAPILLARY: 85 mg/dL (ref 70–99)

## 2018-05-31 MED ORDER — ENOXAPARIN SODIUM 40 MG/0.4ML ~~LOC~~ SOLN: 40.0000 mg | SUBCUTANEOUS | Status: DC

## 2018-05-31 MED ORDER — SODIUM CHLORIDE 0.9% FLUSH: 10.0000 mL | INTRAVENOUS | Status: DC | PRN

## 2018-05-31 MED ORDER — AMPICILLIN IV (FOR PTA / DISCHARGE USE ONLY): 2.0000 g | Freq: Four times a day (QID) | INTRAVENOUS | 0 refills | Status: AC

## 2018-05-31 MED ORDER — HYDROCODONE-ACETAMINOPHEN 5-325 MG PO TABS: 1.0000 | ORAL_TABLET | Freq: Four times a day (QID) | ORAL | 0 refills | Status: AC | PRN

## 2018-05-31 NOTE — Care Management Note (Signed)
Case Management Note  Patient Details  Name: Steven Coleman MRN: [id number] Date of Birth: 02-12-1951   Patient discharged home today.  Patient will be receiving IV antibiotics.  [facility] selected.  Brad with [facility] notified of referral.  After receiving cost of daily copay, patient questioned if he should look in to a different company.  RNCM offered to reach out to another agency to determine if the copay would be the same.  Patient discussed with wife, and decided to proceed with [place].  Education was provided at bedside by [place] prior to discharge.    Subjective/Objective:                    Action/Plan:   Expected Discharge Date:  05/31/18               Expected Discharge Plan:  [place]  In-House Referral:     Discharge planning Services  CM Consult  Post Acute Care Choice:  Home Health Choice offered to:  Patient  DME Arranged:    DME Agency:     HH Arranged:  RN [place] Agency:  [facility]  Status of Service:  Completed, signed off  If discussed at [place] of Stay Meetings, dates discussed:    Additional Comments:  Beverly Sessions, RN 05/31/2018, 4:28 PM

## 2018-05-31 NOTE — Progress Notes (Signed)
Voided 100 ML and PVR was 107 mL.

## 2018-05-31 NOTE — Consult Note (Signed)
PHARMACY CONSULT NOTE FOR:  OUTPATIENT  PARENTERAL ANTIBIOTIC THERAPY (OPAT)  Indication: Bacteremia Regimen: Ampicillin 2 gm every 6 hours End date: 06/12/2018  IV antibiotic discharge orders are pended. To discharging provider:  please sign these orders via discharge navigator,  Select New Orders & click on the button choice - Manage This Unsigned Work.     Thank you for allowing pharmacy to be a part of this patient's care.  Forrest Moron, PharmD 05/31/2018, 11:08 AM

## 2018-05-31 NOTE — Progress Notes (Signed)
Peripherally Inserted Central Catheter/Midline Placement  The IV Nurse has discussed with the patient and/or persons authorized to consent for the patient, the purpose of this procedure and the potential benefits and risks involved with this procedure.  The benefits include less needle sticks, lab draws from the catheter, and the patient may be discharged home with the catheter. Risks include, but not limited to, infection, bleeding, blood clot (thrombus formation), and puncture of an artery; nerve damage and irregular heartbeat and possibility to perform a PICC exchange if needed/ordered by physician.  Alternatives to this procedure were also discussed.  Bard Power PICC patient education guide, fact sheet on infection prevention and patient information card has been provided to patient /or left at bedside.    PICC/Midline Placement Documentation  PICC Single Lumen 79/89/21 PICC Right Basilic 42 cm 1 cm (Active)  Indication for Insertion or Continuance of Line Home intravenous therapies (PICC only) 05/31/2018  9:30 AM  Exposed Catheter (cm) 1 cm 05/31/2018  9:30 AM  Site Assessment Clean;Dry;Intact 05/31/2018  9:30 AM  Line Status Flushed;Blood return noted;Saline locked 05/31/2018  9:30 AM  Dressing Type Transparent 05/31/2018  9:30 AM  Dressing Status Clean;Dry;Intact 05/31/2018  9:30 AM  Dressing Change Due 06/07/18 05/31/2018  9:30 AM       Steven Coleman 05/31/2018, 9:47 AM

## 2018-05-31 NOTE — Progress Notes (Signed)
UROLOGY PROGRESS NOTE  Afebrile, VSS, feels well Denies abdominal/flank pain  Alert, NAD, conversational Abdomen soft Urine clear yellow per foley  WBC 15.3(14.6) Cr 1.01(1.07)  Blood cultures with enterococcus, sensitivities pending  A/P: Steven Coleman is a 67 year old male with fever, bacteremia, right proximal ureteral stone and bladder stone post-op day #2 cystoscopy, right ureteral stent placement. Clinically doing well, afebrile since stent placement.  Continue abx, narrow as able pending cultures, recommend 10-14 day duration OK to remove foley today, recommend bladder scanning after first few voids, patient should perform CIC if PVR's >300cc. He catheterizes occasionally at home, and he has supplies at home We will arrange follow up for definitive management of ureteral and bladder stone in 2-3 weeks when infection treated Call if questions  Nickolas [place], MD 05/31/2018

## 2018-05-31 NOTE — Progress Notes (Signed)
Voided 50 mL and post void residual was 107 ML.

## 2018-05-31 NOTE — Progress Notes (Signed)
Discharge order received. Patient is alert and oriented. Vital signs stable . No signs of acute distress. Discharge instructions given. Patient verbalized understanding. No other issues noted at this time. Home health RN came and educated pt regarding home care PICC remains intact.

## 2018-05-31 NOTE — Progress Notes (Signed)
Foley removed at 10:00.Due to void 18:00.

## 2018-06-01 ENCOUNTER — Telehealth: Payer: Self-pay | Admitting: Radiology

## 2018-06-01 DIAGNOSIS — N12 Tubulo-interstitial nephritis, not specified as acute or chronic: Secondary | ICD-10-CM | POA: Diagnosis not present

## 2018-06-01 DIAGNOSIS — Z452 Encounter for adjustment and management of vascular access device: Secondary | ICD-10-CM | POA: Diagnosis not present

## 2018-06-01 DIAGNOSIS — Z960 Presence of urogenital implants: Secondary | ICD-10-CM | POA: Diagnosis not present

## 2018-06-01 DIAGNOSIS — A4181 Sepsis due to Enterococcus: Secondary | ICD-10-CM | POA: Diagnosis not present

## 2018-06-01 DIAGNOSIS — N132 Hydronephrosis with renal and ureteral calculous obstruction: Secondary | ICD-10-CM | POA: Diagnosis not present

## 2018-06-01 DIAGNOSIS — Z792 Long term (current) use of antibiotics: Secondary | ICD-10-CM | POA: Diagnosis not present

## 2018-06-01 DIAGNOSIS — Z9181 History of falling: Secondary | ICD-10-CM | POA: Diagnosis not present

## 2018-06-01 DIAGNOSIS — N401 Enlarged prostate with lower urinary tract symptoms: Secondary | ICD-10-CM | POA: Diagnosis not present

## 2018-06-01 DIAGNOSIS — R338 Other retention of urine: Secondary | ICD-10-CM | POA: Diagnosis not present

## 2018-06-01 LAB — CULTURE, URINE COMPREHENSIVE

## 2018-06-01 LAB — CULTURE, BLOOD (ROUTINE X 2)

## 2018-06-01 LAB — URINE CULTURE: Culture: 80000 — AB

## 2018-06-01 NOTE — Telephone Encounter (Signed)
LMOM to return call. Need to speak to patient to schedule surgery with Dr Diamantina Providence.

## 2018-06-01 NOTE — Discharge Summary (Signed)
[place] [facility] [place] NAME: Steven Coleman    MR#:  [id number]  DATE OF BIRTH:  1951/03/21  DATE OF ADMISSION:  05/29/2018 ADMITTING PHYSICIAN: Epifanio Lesches, MD  DATE OF DISCHARGE: 05/31/2018  2:27 PM  PRIMARY CARE PHYSICIAN: Mina Marble D, NP   ADMISSION DIAGNOSIS:  Renal colic on right side [Z66] Bacteremia due to Enterococcus [R78.81, B95.2] Acute pyelonephritis Right ureteral stone Hypokalemia DISCHARGE DIAGNOSIS:  Enterococcal bacteremia Right ureteral stone Hypokalemia Acute pyelonephritis Leukocytosis SECONDARY DIAGNOSIS:   Past Medical History:  Diagnosis Date  . BPH (benign prostatic hyperplasia)      ADMITTING HISTORY Steven Coleman  is a 67 y.o. male with a known history of BPH, urine retention presented to ED yesterday for fever, right flank pain, signed out AMA last night, went home with Bactrim, Omnicef.  Today his blood cultures came back positive so patient is urged to come back.  Patient signed out last night , the ER recommended to stay because of right-sided pyelonephritis with fever.  He received fluids last night, went home with antibiotics comes back because he was called in for positive blood infection.  He denies any fever, nausea or vomiting.  Has right flank pain.  Found to have mild 5 mm stone in the right ureter with mild hydronephrosis, seen by [facility] and getting emergency right ureteral stone.Declined admission last night.  HOSPITAL COURSE:  Patient was admitted to medical floor.  He was started on IV meropenem antibiotic initially and IV fluids.  Urology consultation was done for the ureteral stone.  Potassium was replaced aggressively.  Blood cultures and urine cultures grew enterococcus.  Patient was switched to IV ampicillin antibiotic.  Infectious disease consultation was done during hospitalization.  Patient was worked up with CT kidney stone protocol during the hospitalization.   Patient underwent cystoscopy and right ureteral stent placement by urology.  Patient tolerated procedure well.  He had PICC line placed in the arm.  He will be discharged home on IV antibiotics for 2 weeks with home health services.  Follow-up with urology in the clinic for further intervention and stone extraction and stent removal once infection has resolved.  CONSULTS OBTAINED:  Treatment Team:  Tsosie Billing, MD Billey Co, MD  DRUG ALLERGIES:  No Known Allergies  DISCHARGE MEDICATIONS:   Allergies as of 05/31/2018   No Known Allergies     Medication List    STOP taking these medications   cefdinir 300 MG capsule Commonly known as:  OMNICEF   ibuprofen 400 MG tablet Commonly known as:  ADVIL,MOTRIN   sulfamethoxazole-trimethoprim 800-160 MG tablet Commonly known as:  BACTRIM DS,SEPTRA DS     TAKE these medications   acetaminophen 500 MG tablet Commonly known as:  TYLENOL Take 1,000 mg by mouth every 6 (six) hours as needed for moderate pain.   ampicillin  IVPB Inject 2 g into the vein every 6 (six) hours for 12 days. Indication:  Bacteremia Last Day of Therapy:  06/12/2018 Labs - Once weekly:  CBC/D and CMP   HYDROcodone-acetaminophen 5-325 MG tablet Commonly known as:  NORCO/VICODIN Take 1-2 tablets by mouth every 6 (six) hours as needed for up to 5 days for moderate pain.   ondansetron 4 MG disintegrating tablet Commonly known as:  ZOFRAN-ODT Take 1 tablet (4 mg total) by mouth every 8 (eight) hours as needed for nausea or vomiting.   tamsulosin 0.4 MG Caps capsule Commonly known as:  FLOMAX Take 1  capsule (0.4 mg total) by mouth daily.            Home Infusion Instuctions  (From admission, onward)         Start     Ordered   05/31/18 0000  Home infusion instructions [facility] May follow [facility] Dosing Protocol; May administer Cathflo as needed to maintain patency of vascular access device.; Flushing of vascular access  device: per [facility] Protocol: 0.9% NaCl pre/post medica...    Question Answer Comment  Instructions May follow [facility] Dosing Protocol   Instructions May administer Cathflo as needed to maintain patency of vascular access device.   Instructions Flushing of vascular access device: per [facility] Protocol: 0.9% NaCl pre/post medication administration and prn patency; Heparin 100 u/ml, 28ml for implanted ports and Heparin 10u/ml, 28ml for all other central venous catheters.   Instructions May follow [facility] Anaphylaxis Protocol for First Dose Administration in the home: 0.9% NaCl at 25-50 ml/hr to maintain IV access for protocol meds. Epinephrine 0.3 ml IV/IM PRN and Benadryl 25-50 IV/IM PRN s/s of anaphylaxis.   Instructions [facility] Infusion Coordinator (RN) to assist per patient IV care needs in the home PRN.      05/31/18 1120          Today  Patient seen and evaluated on the day of discharge No abdominal pain No fever Tolerated diet well  VITAL SIGNS:  Blood pressure (!) 151/80, pulse 67, temperature 97.9 F (36.6 C), temperature source Oral, resp. rate 16, height 5' 9.5" (1.765 m), weight 79.2 kg, SpO2 100 %.  I/O:  No intake or output data in the 24 hours ending 06/01/18 1345  PHYSICAL EXAMINATION:  Physical Exam  GENERAL:  67 y.o.-year-old patient lying in the bed with no acute distress.  LUNGS: Normal breath sounds bilaterally, no wheezing, rales,rhonchi or crepitation. No use of accessory muscles of respiration.  CARDIOVASCULAR: S1, S2 normal. No murmurs, rubs, or gallops.  ABDOMEN: Soft, non-tender, non-distended. Bowel sounds present. No organomegaly or mass.  NEUROLOGIC: Moves all 4 extremities. PSYCHIATRIC: The patient is alert and oriented x 3.  SKIN: No obvious rash, lesion, or ulcer.   DATA REVIEW:   CBC Recent Labs  Lab 05/31/18 0456  WBC 15.3*  HGB 13.5  HCT 41.2  PLT 172    Chemistries  Recent Labs  Lab 05/28/18 1658  05/31/18 0456  NA 134*   < >  139  K 3.2*   < > 4.0  CL 101   < > 107  CO2 22   < > 25  GLUCOSE 105*   < > 111*  BUN 28*   < > 21  CREATININE 1.51*   < > 1.01  CALCIUM 8.6*   < > 8.3*  AST 27  --   --   ALT 18  --   --   ALKPHOS 48  --   --   BILITOT 1.3*  --   --    < > = values in this interval not displayed.    Cardiac Enzymes No results for input(s): TROPONINI in the last 168 hours.  Microbiology Results  Results for orders placed or performed during the hospital encounter of 05/29/18  MRSA PCR Screening     Status: None   Collection Time: 05/29/18  4:23 PM  Result Value Ref Range Status   MRSA by PCR NEGATIVE NEGATIVE Final    Comment:        The GeneXpert MRSA Assay (  FDA approved for NASAL specimens only), is one component of a comprehensive MRSA colonization surveillance program. It is not intended to diagnose MRSA infection nor to guide or monitor treatment for MRSA infections. Performed at [facility], [address] [place], [place] [address]   Culture, blood (Routine X 2) w Reflex to ID Panel     Status: None (Preliminary result)   Collection Time: 05/30/18  2:10 PM  Result Value Ref Range Status   Specimen Description BLOOD BLOOD LEFT HAND  Final   Special Requests   Final    BOTTLES DRAWN AEROBIC AND ANAEROBIC Blood Culture adequate volume   Culture   Final    NO GROWTH < 24 HOURS Performed at [facility], [address], [place], [place] [address]    Report Status PENDING  Incomplete  Culture, blood (Routine X 2) w Reflex to ID Panel     Status: None (Preliminary result)   Collection Time: 05/30/18  2:10 PM  Result Value Ref Range Status   Specimen Description BLOOD BLOOD RIGHT WRIST  Final   Special Requests   Final    BOTTLES DRAWN AEROBIC AND ANAEROBIC Blood Culture adequate volume   Culture   Final    NO GROWTH < 24 HOURS Performed at [facility], [address] [place], [place] [address]    Report Status PENDING  Incomplete     RADIOLOGY:  [place] Ekg Site Rite  Result Date: 05/31/2018 If Site Rite image not attached, placement could not be confirmed due to current cardiac rhythm.   Follow up with PCP in 1 week.  Management plans discussed with the patient, family and they are in agreement.  CODE STATUS: Full code Code Status History    Date Active Date Inactive Code Status Order ID Comments User Context   05/29/2018 1532 05/31/2018 1733 Full Code [id number]  Epifanio Lesches, MD ED   06/03/2016 1556 06/04/2016 1306 Full Code [id number]  Elgergawy, Silver Huguenin, MD Inpatient    Advance Directive Documentation     Most Recent Value  Type of Advance Directive  Healthcare Power of Attorney  Pre-existing out of facility DNR order (yellow form or pink MOST form)  -  "MOST" Form in Place?  -      TOTAL TIME TAKING CARE OF THIS PATIENT ON DAY OF DISCHARGE: more than 33 minutes.   Saundra Shelling M.D on 06/01/2018 at 1:45 PM  Between 7am to 6pm - Pager - [phone]  After 6pm go to [email or web address] - password [email or web address] [facility] [facility]  Office  [phone]  CC: Primary care physician; Esaw Grandchild, NP  Note: This dictation was prepared with Dragon dictation along with smaller phrase technology. Any transcriptional errors that result from this process are unintentional.

## 2018-06-04 ENCOUNTER — Other Ambulatory Visit: Payer: Self-pay | Admitting: Radiology

## 2018-06-04 DIAGNOSIS — N210 Calculus in bladder: Secondary | ICD-10-CM

## 2018-06-04 DIAGNOSIS — N12 Tubulo-interstitial nephritis, not specified as acute or chronic: Secondary | ICD-10-CM | POA: Diagnosis not present

## 2018-06-04 DIAGNOSIS — N401 Enlarged prostate with lower urinary tract symptoms: Secondary | ICD-10-CM | POA: Diagnosis not present

## 2018-06-04 DIAGNOSIS — N201 Calculus of ureter: Secondary | ICD-10-CM

## 2018-06-04 DIAGNOSIS — N132 Hydronephrosis with renal and ureteral calculous obstruction: Secondary | ICD-10-CM | POA: Diagnosis not present

## 2018-06-04 DIAGNOSIS — N329 Bladder disorder, unspecified: Secondary | ICD-10-CM

## 2018-06-04 DIAGNOSIS — Z452 Encounter for adjustment and management of vascular access device: Secondary | ICD-10-CM | POA: Diagnosis not present

## 2018-06-04 DIAGNOSIS — Z9181 History of falling: Secondary | ICD-10-CM | POA: Diagnosis not present

## 2018-06-04 DIAGNOSIS — Z792 Long term (current) use of antibiotics: Secondary | ICD-10-CM | POA: Diagnosis not present

## 2018-06-04 DIAGNOSIS — R338 Other retention of urine: Secondary | ICD-10-CM | POA: Diagnosis not present

## 2018-06-04 DIAGNOSIS — A4181 Sepsis due to Enterococcus: Secondary | ICD-10-CM | POA: Diagnosis not present

## 2018-06-04 LAB — CULTURE, BLOOD (ROUTINE X 2)
CULTURE: NO GROWTH
CULTURE: NO GROWTH
Special Requests: ADEQUATE
Special Requests: ADEQUATE

## 2018-06-04 NOTE — Telephone Encounter (Signed)
Wife called back to schedule bladder biopsy, ureteroscopy, laser lithotripsy & stent exchange & cystolitholapaxy. Wife will be unavailable on 06/26/2108 through 06/29/2018 so requests surgery on 07/06/2018 with pre-admission testing appointment on 06/25/2018. Ok per Dr Diamantina [facility]. Instructions given. Questions answered. Wife voices understanding.

## 2018-06-05 ENCOUNTER — Ambulatory Visit (INDEPENDENT_AMBULATORY_CARE_PROVIDER_SITE_OTHER): Payer: Medicare Other

## 2018-06-05 ENCOUNTER — Inpatient Hospital Stay: Payer: Medicare Other | Admitting: Adult Health

## 2018-06-05 VITALS — BP 147/74 | HR 73 | Ht 69.0 in | Wt 161.4 lb

## 2018-06-05 DIAGNOSIS — R339 Retention of urine, unspecified: Secondary | ICD-10-CM | POA: Diagnosis not present

## 2018-06-05 LAB — BLADDER SCAN AMB NON-IMAGING

## 2018-06-05 NOTE — Progress Notes (Signed)
Catheter Placement  Patient is present today for a catheter placement.Patient was cleaned and prepped in a sterile fashion with betadine. A 16FR Coude foley cath was placed into the bladder no complications were noted Urine return was noted 164ml and urine was pale yellow in color. The balloon was filled with 27ml of sterile water. A leg bag was attached for drainage.  A night bag was also given to the patient and patient was given instruction on how to change from one bag to another. Patient was given proper instruction on catheter care. Bladder scan was performed at the request of Dr. Diamantina [facility] in order to determine if catheter is needed. After discussion with Dr.Sninsky catheter is placed. PVR greater than 350mL.  Preformed by: Gordy Clement, CMA (AAMA)   Follow up: As scheduled

## 2018-06-07 ENCOUNTER — Ambulatory Visit: Payer: Medicare Other | Attending: Infectious Diseases | Admitting: Infectious Diseases

## 2018-06-07 ENCOUNTER — Encounter: Payer: Self-pay | Admitting: Infectious Diseases

## 2018-06-07 VITALS — BP 156/88 | HR 60 | Temp 97.8°F | Wt 165.1 lb

## 2018-06-07 DIAGNOSIS — Z792 Long term (current) use of antibiotics: Secondary | ICD-10-CM | POA: Diagnosis not present

## 2018-06-07 DIAGNOSIS — Z79899 Other long term (current) drug therapy: Secondary | ICD-10-CM

## 2018-06-07 DIAGNOSIS — B952 Enterococcus as the cause of diseases classified elsewhere: Secondary | ICD-10-CM

## 2018-06-07 DIAGNOSIS — N210 Calculus in bladder: Secondary | ICD-10-CM

## 2018-06-07 DIAGNOSIS — N400 Enlarged prostate without lower urinary tract symptoms: Secondary | ICD-10-CM

## 2018-06-07 DIAGNOSIS — Z960 Presence of urogenital implants: Secondary | ICD-10-CM | POA: Diagnosis not present

## 2018-06-07 DIAGNOSIS — Z95828 Presence of other vascular implants and grafts: Secondary | ICD-10-CM

## 2018-06-07 DIAGNOSIS — R7881 Bacteremia: Secondary | ICD-10-CM | POA: Diagnosis not present

## 2018-06-07 DIAGNOSIS — N136 Pyonephrosis: Secondary | ICD-10-CM | POA: Diagnosis not present

## 2018-06-07 DIAGNOSIS — N390 Urinary tract infection, site not specified: Secondary | ICD-10-CM | POA: Diagnosis not present

## 2018-06-07 NOTE — Patient Instructions (Addendum)
You are here for follow up of enterococcus infection in blood and urine- you are on ampicillin IV. You will complete 2 weeks of treatment on 06/12/18.you are going for an urological procedure on Nov 15th. Your urine will have to be checked before that to make sure there is no bacteria.

## 2018-06-07 NOTE — Progress Notes (Signed)
NAME: Steven Coleman  DOB: [birth year]  MRN: [id number]  Date/Time: 06/07/2018 10:15 AM Subjective:  REASON FOR CONSULT: Follow-up visit after hospital discharge ? Steven Coleman is a 67 y.o. male with a history of BPH, recent hospitalization for enterococcus bacteremia and right pyelonephritis, discharged on IV ampicillin is here for follow-up.  Patient was hospitalized between 05/29/2018 until 05/31/2018 during that hospitalization he had a CT abdomen which revealed a 5 mm stone in the Proximal right ureter causing mild hydronephrosis.  There was also a large bladder calculus of 2 cm.  He was seen by urologist and had a cystoscopy and he underwent stent placement of the right ureter.  Cystoscopy also revealed a possible bladder mass versus infected friable tissue apart from a bladder stone. He also had a high leukocytosis and AKI which have all resolved. Patient has a history of severe BPH.  For which he underwent prostate artery embolization in 2018 with some relief.  He has been taking tamsulosin.  He had a Foley catheter placed during the hospitalization and on discharge it was removed.  But as he was self catheterizing 6-8 times a day another Foley was placed as outpatient in the urologist office 3 days ago.  He has an appointment with them on November 15 for a cystoscopy procedure.  He is on ampicillin currently and will be finishing on 22 October. He has no side effects from the antibiotics. He has no fever, chills or right flank pain or hematuria. Past Medical History:  Diagnosis Date  . BPH (benign prostatic hyperplasia)     Past Surgical History:  Procedure Laterality Date  . CYSTOSCOPY WITH STENT PLACEMENT Right 05/29/2018   Procedure: CYSTOSCOPY WITH STENT PLACEMENT;  Surgeon: Billey Co, MD;  Location: [facility] ORS;  Service: Urology;  Laterality: Right;  . EYE SURGERY    . 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 prostatic artery embolization    . SHOULDER SURGERY Left 1982  .  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 Cancer Mother        lung  . Cancer Father        throat  . Prostate cancer Neg Hx   . Kidney cancer Neg Hx   . Bladder Cancer Neg Hx    No Known Allergies ? Current Outpatient Medications  Medication Sig Dispense Refill  . acetaminophen (TYLENOL) 500 MG tablet Take 1,000 mg by mouth every 6 (six) hours as needed for moderate pain.    Marland Kitchen ampicillin IVPB Inject 2 g into the vein every 6 (six) hours for 12 days. Indication:  Bacteremia Last Day of Therapy:  06/12/2018 Labs - Once weekly:  CBC/D and CMP 48 Units 0  . tamsulosin (FLOMAX) 0.4 MG CAPS capsule Take 1 capsule (0.4 mg total) by mouth daily. 30 capsule 11  . ondansetron (ZOFRAN ODT) 4 MG disintegrating tablet Take 1 tablet (4 mg total) by mouth every 8 (eight) hours as needed for nausea or vomiting. (Patient not taking: Reported on 06/07/2018) 20 tablet 0   Current Facility-Administered Medications  Medication Dose Route Frequency Provider Last Rate Last Dose  . lidocaine (XYLOCAINE) 2 % jelly 1 application  1 application Urethral Once Festus Aloe, MD         Abtx:  Anti-infectives (From admission, onward)   None  REVIEW OF SYSTEMS:  Const: negative fever, negative chills, negative weight loss Eyes: negative diplopia or visual changes, negative eye pain ENT: negative coryza, negative sore throat Resp: negative cough, hemoptysis, dyspnea Cards: negative for chest pain, palpitations, lower extremity edema GU: Has a Foley catheter GI: Negative for abdominal pain, diarrhea, bleeding, constipation Skin: negative for rash and pruritus Heme: negative for easy bruising and gum/nose bleeding MS: negative for myalgias, arthralgias, back pain and muscle weakness Neurolo:negative for headaches, dizziness, vertigo, memory problems  Psych: negative for feelings  of anxiety, depression  Endocrine: No polyuria or polydipsia Allergy/Immunology- negative for any medication or food allergies  Objective:  VITALS:  BP (!) 156/88 (BP Location: Left Arm, Patient Position: Sitting, Cuff Size: Normal)   Pulse 60   Temp 97.8 F (36.6 C) (Oral)   Wt 165 lb 2 oz (74.9 kg)   BMI 24.38 kg/m  PHYSICAL EXAM:  General: Alert, cooperative, no distress, appears stated age.  Head: Normocephalic, without obvious abnormality, atraumatic. Eyes: Conjunctivae clear, anicteric sclerae. Pupils are equal ENT Nares normal. No drainage or sinus tenderness. Lips, mucosa, and tongue normal. No Thrush Neck: Supple, symmetrical, no adenopathy, thyroid: non tender no carotid bruit and no JVD. Back: No CVA tenderness. Lungs: Clear to auscultation bilaterally. No Wheezing or Rhonchi. No rales. Heart: Regular rate and rhythm, no murmur, rub or gallop. Abdomen: Soft, non-tender,not distended. Bowel sounds normal. No masses Extremities: atraumatic, no cyanosis. No edema. No clubbing Right PICC line site no erythema or tenderness Skin: No rashes or lesions. Or bruising Lymph: Cervical, supraclavicular normal. Neurologic: Grossly non-focal Pertinent Labs 06/04/2018 Creatinine 0.88 WBC 10.4 Hemoglobin 12.7 Platelets 300  ? Impression/Recommendation ?67 y.o. male with a history of BPH, recent hospitalization for enterococcus bacteremia and right pyelonephritis, discharged on IV ampicillin is here for follow-up.  Patient was hospitalized between 05/29/2018 until 05/31/2018 during that hospitalization he had a CT abdomen which revealed a 5 mm stone in the Proximal right ureter causing mild hydronephrosis.  There was also a large bladder calculus of 2 cm.  He was seen by urologist and had a cystoscopy and he underwent stent placement of the right ureter.  Cystoscopy also revealed a possible bladder mass versus infected friable tissue apart from a bladder stone. He also had a high  leukocytosis and AKI which have all resolved.  Enterococcus bacteremia secondary to enterococcus urinary tract infection.  On IV ampicillin 2 g every 6 until 06/12/2018 when he will finish 2 weeks of treatment.  Right hydronephrosis pyelonephritis secondary to ureteral stone for which she has had a stent placement  Bladder calculus Possible bladder friable mass versus infective tissue  Benign prostatic hypertrophy status post prostate artery embolization.  On tamsulosin now has a Foley catheter  Patient is followed by urologist and he is going to have a procedure on November 15. He will need urine analysis and culture before the cystoscopy.  Antibiotic treatment as needed if the culture is positive or antibiotic prophylaxis before the cystoscopy procedure. ? Talk to both patient and his wife and answered their questions. 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  Patient states he normally leads an active healthy life and would like to avoid any medication or vaccines and  have never taken vaccines in many years.? He does not need routine follow-up appointment with me. Follow-up as needed

## 2018-06-11 ENCOUNTER — Telehealth: Payer: Self-pay | Admitting: Adult Health

## 2018-06-11 DIAGNOSIS — A4181 Sepsis due to Enterococcus: Secondary | ICD-10-CM | POA: Diagnosis not present

## 2018-06-11 DIAGNOSIS — N390 Urinary tract infection, site not specified: Secondary | ICD-10-CM | POA: Diagnosis not present

## 2018-06-11 DIAGNOSIS — R338 Other retention of urine: Secondary | ICD-10-CM | POA: Diagnosis not present

## 2018-06-11 DIAGNOSIS — N401 Enlarged prostate with lower urinary tract symptoms: Secondary | ICD-10-CM | POA: Diagnosis not present

## 2018-06-11 DIAGNOSIS — Z452 Encounter for adjustment and management of vascular access device: Secondary | ICD-10-CM | POA: Diagnosis not present

## 2018-06-11 DIAGNOSIS — R7881 Bacteremia: Secondary | ICD-10-CM | POA: Diagnosis not present

## 2018-06-11 DIAGNOSIS — N132 Hydronephrosis with renal and ureteral calculous obstruction: Secondary | ICD-10-CM | POA: Diagnosis not present

## 2018-06-11 DIAGNOSIS — N12 Tubulo-interstitial nephritis, not specified as acute or chronic: Secondary | ICD-10-CM | POA: Diagnosis not present

## 2018-06-11 DIAGNOSIS — B952 Enterococcus as the cause of diseases classified elsewhere: Secondary | ICD-10-CM | POA: Diagnosis not present

## 2018-06-11 DIAGNOSIS — R109 Unspecified abdominal pain: Secondary | ICD-10-CM | POA: Diagnosis not present

## 2018-06-11 NOTE — Telephone Encounter (Signed)
Marijean Heath with [facility] is requesting verbal orders to remove patient's Foley cath on Wednesday when they are out there to remove patient's PICC line. Please advise and call back at [phone]

## 2018-06-11 NOTE — Telephone Encounter (Signed)
Shouldn't this come from urology?  Please advise.  Charyl Bigger, CMA

## 2018-06-11 NOTE — Telephone Encounter (Signed)
Sonja informed.  Charyl Bigger, CMA

## 2018-06-11 NOTE — Telephone Encounter (Signed)
Yes Please have them request this from his Urologist Thanks! Valetta Fuller

## 2018-06-12 ENCOUNTER — Telehealth: Payer: Self-pay | Admitting: Urology

## 2018-06-12 NOTE — Telephone Encounter (Signed)
Mitzi Hansen with [facility] called and left a message on the voicemail after lunch asking for an order to remove patient's foley. She would like a call back @ [phone]

## 2018-06-13 DIAGNOSIS — N132 Hydronephrosis with renal and ureteral calculous obstruction: Secondary | ICD-10-CM | POA: Diagnosis not present

## 2018-06-13 DIAGNOSIS — R338 Other retention of urine: Secondary | ICD-10-CM | POA: Diagnosis not present

## 2018-06-13 DIAGNOSIS — A4181 Sepsis due to Enterococcus: Secondary | ICD-10-CM | POA: Diagnosis not present

## 2018-06-13 DIAGNOSIS — N12 Tubulo-interstitial nephritis, not specified as acute or chronic: Secondary | ICD-10-CM | POA: Diagnosis not present

## 2018-06-13 DIAGNOSIS — Z452 Encounter for adjustment and management of vascular access device: Secondary | ICD-10-CM | POA: Diagnosis not present

## 2018-06-13 DIAGNOSIS — N401 Enlarged prostate with lower urinary tract symptoms: Secondary | ICD-10-CM | POA: Diagnosis not present

## 2018-06-13 NOTE — Telephone Encounter (Signed)
Spoke to Drayton from [facility] and gave authorization for Catheter to be removed. He had catheter placed because he was on ABX and fluid via IV. He was unable to get up to go to the restroom at the time. IV was discontinued yesterday and Catheter is to be removed today. Steven Coleman was notified that if the patient has any difficulty urinating he will need to have the catheter replaced. Sonya voiced understanding.

## 2018-06-18 ENCOUNTER — Telehealth: Payer: Self-pay | Admitting: Licensed Clinical Social Worker

## 2018-06-18 ENCOUNTER — Telehealth: Payer: Self-pay | Admitting: Urology

## 2018-06-18 NOTE — Telephone Encounter (Signed)
Pt's wife called to let us know his IV antibiotics til last Tuesday.  He started running a fever today and had night sweats last night.  They wanted to let us know what was going on, because they think it's a bladder/kidney infection.  Please call her Baldo Ash [phone]

## 2018-06-18 NOTE — Telephone Encounter (Signed)
Patient's wife called stating the patient's PICC line was removed on 10/23 and on 10/25 he started have to have body aches. This morning he has a fever of 102.7 and was given Tylenol which brought it down to 101.6. Patient thinks he may have come down with Flu virus. Patient would like to wait to see how he feels in the morning then he will call back or go to the ED if he begins to feel worse overnight. Patient's wife also called urology to make see if they had any advice.

## 2018-06-19 ENCOUNTER — Other Ambulatory Visit: Payer: Self-pay | Admitting: Infectious Diseases

## 2018-06-19 ENCOUNTER — Telehealth: Payer: Self-pay | Admitting: Infectious Diseases

## 2018-06-19 DIAGNOSIS — N390 Urinary tract infection, site not specified: Principal | ICD-10-CM

## 2018-06-19 DIAGNOSIS — B952 Enterococcus as the cause of diseases classified elsewhere: Secondary | ICD-10-CM

## 2018-06-19 NOTE — Telephone Encounter (Signed)
Called patient as he had called earlier regarding fever. Spoke to his wife and patient/ He had  fever last night with chills but did not want to go to the Ed as advised by by my CMA yesterday ( after discussing with me). Today he says he is better. Still had a temp of 100.9. He has renal stones, stent and recently treated for enterococcus in the blood and urine with IV ampicillin and he completed treatment last week 10/25 and PICC removed- 2 days later he had body aches and then fever Also his foley got removed the same day. Told him to go to the ED if his temp > 102 or > 101 and remains X 2 readings- emphasized the fact that he can get septic. He understands If he remains stable He will come tomorrow for blood culture, cbc, UA, UC

## 2018-06-20 ENCOUNTER — Other Ambulatory Visit
Admission: RE | Admit: 2018-06-20 | Discharge: 2018-06-20 | Disposition: A | Discharge status: Home / Self Care | Payer: Medicare Other | Source: Ambulatory Visit | Attending: Infectious Diseases | Admitting: Infectious Diseases

## 2018-06-20 ENCOUNTER — Other Ambulatory Visit: Payer: Self-pay | Admitting: Licensed Clinical Social Worker

## 2018-06-20 ENCOUNTER — Telehealth: Payer: Self-pay | Admitting: Licensed Clinical Social Worker

## 2018-06-20 DIAGNOSIS — N390 Urinary tract infection, site not specified: Secondary | ICD-10-CM | POA: Diagnosis not present

## 2018-06-20 DIAGNOSIS — B952 Enterococcus as the cause of diseases classified elsewhere: Secondary | ICD-10-CM | POA: Diagnosis not present

## 2018-06-20 LAB — CBC WITH DIFFERENTIAL/PLATELET
Abs Immature Granulocytes: 0.06 10*3/uL (ref 0.00–0.07)
BASOS ABS: 0 10*3/uL (ref 0.0–0.1)
BASOS PCT: 0 %
EOS ABS: 0.2 10*3/uL (ref 0.0–0.5)
Eosinophils Relative: 2 %
HCT: 40 % (ref 39.0–52.0)
Hemoglobin: 13.3 g/dL (ref 13.0–17.0)
IMMATURE GRANULOCYTES: 1 %
LYMPHS ABS: 1.3 10*3/uL (ref 0.7–4.0)
Lymphocytes Relative: 13 %
MCH: 29.5 pg (ref 26.0–34.0)
MCHC: 33.3 g/dL (ref 30.0–36.0)
MCV: 88.7 fL (ref 80.0–100.0)
Monocytes Absolute: 1 10*3/uL (ref 0.1–1.0)
Monocytes Relative: 10 %
NEUTROS PCT: 74 %
NRBC: 0 % (ref 0.0–0.2)
Neutro Abs: 7.8 10*3/uL — ABNORMAL HIGH (ref 1.7–7.7)
PLATELETS: 281 10*3/uL (ref 150–400)
RBC: 4.51 MIL/uL (ref 4.22–5.81)
RDW: 13 % (ref 11.5–15.5)
WBC: 10.3 10*3/uL (ref 4.0–10.5)

## 2018-06-20 LAB — URINALYSIS, ROUTINE W REFLEX MICROSCOPIC
Bilirubin Urine: NEGATIVE
Glucose, UA: NEGATIVE mg/dL
KETONES UR: 5 mg/dL — AB
Nitrite: NEGATIVE
PH: 5 (ref 5.0–8.0)
Protein, ur: 30 mg/dL — AB
Specific Gravity, Urine: 1.014 (ref 1.005–1.030)

## 2018-06-20 MED ORDER — LEVOFLOXACIN 500 MG PO TABS: 500.0000 mg | ORAL_TABLET | Freq: Every day | ORAL | 0 refills | Status: AC

## 2018-06-20 MED ORDER — AMOXICILLIN 500 MG PO TABS: 500.0000 mg | ORAL_TABLET | Freq: Three times a day (TID) | ORAL | 0 refills | Status: DC

## 2018-06-20 NOTE — Telephone Encounter (Signed)
Spoke with patient's wife and she asked if I call the RX in to [facility]. Explained that the doctor was calling in 2 antibiotics amox 500 tid for 3 days and levaquin 500mg  once daily. This will cover until urine culture and blood culture come back. Patient's wife  stated he would like to be informed of results no matter if it is weekend or late night.

## 2018-06-20 NOTE — Telephone Encounter (Signed)
Left message for patient to call back concerning results and medication being prescribed.

## 2018-06-22 ENCOUNTER — Other Ambulatory Visit: Payer: Self-pay | Admitting: Infectious Diseases

## 2018-06-22 ENCOUNTER — Other Ambulatory Visit: Payer: Self-pay | Admitting: Radiology

## 2018-06-22 ENCOUNTER — Telehealth: Payer: Self-pay | Admitting: Radiology

## 2018-06-22 ENCOUNTER — Telehealth: Payer: Self-pay | Admitting: Family Medicine

## 2018-06-22 LAB — URINE CULTURE

## 2018-06-22 MED ORDER — AMOXICILLIN 500 MG PO CAPS: 500.0000 mg | ORAL_CAPSULE | Freq: Three times a day (TID) | ORAL | 0 refills | Status: DC

## 2018-06-22 NOTE — Progress Notes (Signed)
Enterococcus fecalis in urine Has stone and stent- will continue amoxicillin 500mg  TID for 2 weeks until he goes for the procedure

## 2018-06-22 NOTE — Telephone Encounter (Signed)
-----   Message from Billey Co, MD sent at 06/22/2018  2:57 PM EDT ----- Regarding: urine culture Patient is on abx for UTI. He is scheduled for URS/LL/stent with me 11/15. Please have him come to clinic 11/7 and drop off a urine for culture, we need to confirm clearance of his bacteria prior to surgery.   If urine cx is still positive 11/7, we will admit him for pre-op abx 24 hours prior to surgery with vanc/gent.  Thanks Nickolas Madrid, MD 06/22/2018

## 2018-06-22 NOTE — Telephone Encounter (Signed)
Patient notified and appt scheduled.

## 2018-06-22 NOTE — Telephone Encounter (Signed)
Notified wife of need for urine culture on 06/28/2018 to ensure bacterial clearance prior to surgery. Appointment made. Wife voices understanding.

## 2018-06-22 NOTE — Telephone Encounter (Signed)
Steven Billing, MD at 06/22/2018 1:06 PM   Status: Signed    Enterococcus fecalis in urine Has stone and stent- will continue amoxicillin 500mg  TID for 2 weeks until he goes for the procedure       Patient's wife called concerning upcoming surgery and positive urine culture obtained by infectious disease on 06/20/2018 (results above). Dr Diamantina Providence - does urine culture need to be repeated prior to surgery scheduled 07/06/2018? He should complete course of amoxicillin on the day of surgery.

## 2018-06-25 ENCOUNTER — Other Ambulatory Visit: Payer: Medicare Other

## 2018-06-25 ENCOUNTER — Other Ambulatory Visit: Payer: Self-pay

## 2018-06-25 ENCOUNTER — Encounter
Admission: RE | Admit: 2018-06-25 | Discharge: 2018-06-25 | Disposition: A | Discharge status: Home / Self Care | Payer: Medicare Other | Source: Ambulatory Visit | Attending: Urology | Admitting: Urology

## 2018-06-25 DIAGNOSIS — Z01810 Encounter for preprocedural cardiovascular examination: Secondary | ICD-10-CM | POA: Diagnosis not present

## 2018-06-25 HISTORY — DX: Personal history of urinary calculi: Z87.442

## 2018-06-25 LAB — CULTURE, BLOOD (SINGLE)
CULTURE: NO GROWTH
Culture: NO GROWTH

## 2018-06-25 NOTE — Patient Instructions (Signed)
Your procedure is scheduled on: Friday, July 06, 2018  Report to [facility]    DO NOT STOP ON THE FIRST FLOOR TO REGISTER  To find out your arrival time please call [phone] between 1PM - 3PM on Thursday, July 05, 2018  Remember: Instructions that are not followed completely may result in serious medical risk,  up to and including death, or upon the discretion of your surgeon and anesthesiologist your  surgery may need to be rescheduled.     _X__ 1. Do not eat food after midnight the night before your procedure.                 No gum chewing or hard candies.                    ABSOLUTELY NOTHING SOLID IN YOUR MOUTH AFTER MIDNIGHT                 You may drink clear liquids up to 2 hours before you are scheduled to arrive for your surgery-                   DO not drink clear liquids within 2 hours of the start of your surgery.                  Clear Liquids include:  water, apple juice without pulp, clear carbohydrate                 drink such as Clearfast of Gatorade, Black Coffee or Tea (Do not add                 anything to coffee or tea).  __X__2.  On the morning of surgery brush your teeth with toothpaste and water,                   You may rinse your mouth with mouthwash if you wish.                      Do not swallow any toothpaste of mouthwash.     _X__ 3.  No Alcohol for 24 hours before or after surgery.   _X__ 4.  Do Not Smoke or use e-cigarettes For 24 Hours Prior to Your Surgery.                 Do not use any chewable tobacco products for at least 6 hours prior to                 surgery.  ____  5.  Bring all medications with you on the day of surgery if instructed.   ____  6.  Notify your doctor if there is any change in your medical condition      (cold, fever, infections).     Do not wear jewelry, make-up, hairpins, clips or nail polish. Do not wear lotions, powders, or perfumes. You may wear  deodorant. Do not shave 48 hours prior to surgery. Men may shave face and neck. Do not bring valuables to the hospital.    [facility] is not responsible for any belongings or valuables.  Contacts, dentures or bridgework may not be worn into surgery. Leave your suitcase in the car. After surgery it may be brought to your room. For patients admitted to the hospital, discharge time is determined by your treatment team.   Patients discharged the day of  surgery will not be allowed to drive home.   Please read over the following fact sheets that you were given:   PREPARING FOR SURGERY   __X__ Take these medicines the morning of surgery with A SIP OF WATER:    1. FLOMAX  2. AMOXICILLIN IF STILL HAVE ANY LEFT  3. TYLENOL IF NEEDED  4.  5.  6.  ____ Fleet Enema (as directed)   __X__ TAKE A SHOWER WITH ANTIBACTERIAL SOAP THE NIGHT BEFORE OR THE MORNING AFTER  ____ Stop ALL ASPIRIN AS OF 06/29/2018  ____ Stop Anti-inflammatories AS OF 06/29/2018   ____ Stop supplements until after surgery.    ____ Bring C-Pap to the hospital.   BRING A COPY OF ADVANCE DIRECTIVES SO WE CAN MAKE A COPY TO PUT IN YOUR CHART  WEAR SOMETHING COMFORTABLE TO [place]

## 2018-06-27 ENCOUNTER — Other Ambulatory Visit: Payer: Self-pay | Admitting: Family Medicine

## 2018-06-27 DIAGNOSIS — N201 Calculus of ureter: Secondary | ICD-10-CM

## 2018-06-28 ENCOUNTER — Other Ambulatory Visit: Payer: Medicare Other

## 2018-06-28 DIAGNOSIS — N201 Calculus of ureter: Secondary | ICD-10-CM | POA: Diagnosis not present

## 2018-06-28 LAB — MICROSCOPIC EXAMINATION: WBC UA: NONE SEEN /HPF (ref 0–5)

## 2018-06-28 LAB — URINALYSIS, COMPLETE
BILIRUBIN UA: NEGATIVE
Glucose, UA: NEGATIVE
KETONES UA: NEGATIVE
Nitrite, UA: NEGATIVE
SPEC GRAV UA: 1.02 (ref 1.005–1.030)
Urobilinogen, Ur: 0.2 mg/dL (ref 0.2–1.0)
pH, UA: 5.5 (ref 5.0–7.5)

## 2018-07-01 LAB — CULTURE, URINE COMPREHENSIVE

## 2018-07-02 NOTE — Telephone Encounter (Signed)
Notified patient of negative urine culture & plan to proceed with surgery on 07/06/2018. Patient will call Same Day Surgery on day prior to surgery for arrival time. Questions answered. Patient expresses understanding of conversation.

## 2018-07-05 ENCOUNTER — Encounter: Payer: Self-pay | Admitting: Anesthesiology

## 2018-07-05 MED ORDER — SODIUM CHLORIDE 0.9 % IV SOLN
1.0000 g | INTRAVENOUS | Status: AC
  Administered 2018-07-06: 1 g via INTRAVENOUS
  Filled 2018-07-05: qty 1

## 2018-07-05 MED ORDER — GENTAMICIN SULFATE 40 MG/ML IJ SOLN
5.0000 mg/kg | INTRAVENOUS | Status: AC
  Administered 2018-07-06: 400 mg via INTRAVENOUS
  Filled 2018-07-05: qty 10

## 2018-07-06 ENCOUNTER — Encounter
Admission: RE | Disposition: A | Discharge status: Home / Self Care | Payer: Self-pay | Source: Ambulatory Visit | Attending: Urology

## 2018-07-06 ENCOUNTER — Other Ambulatory Visit: Payer: Self-pay

## 2018-07-06 ENCOUNTER — Ambulatory Visit: Payer: Medicare Other | Admitting: Anesthesiology

## 2018-07-06 ENCOUNTER — Ambulatory Visit
Admission: RE | Admit: 2018-07-06 | Discharge: 2018-07-06 | Disposition: A | Discharge status: Home / Self Care | Payer: Medicare Other | Source: Ambulatory Visit | Attending: Urology | Admitting: Urology

## 2018-07-06 DIAGNOSIS — D303 Benign neoplasm of bladder: Secondary | ICD-10-CM | POA: Insufficient documentation

## 2018-07-06 DIAGNOSIS — N3090 Cystitis, unspecified without hematuria: Secondary | ICD-10-CM | POA: Insufficient documentation

## 2018-07-06 DIAGNOSIS — N3289 Other specified disorders of bladder: Secondary | ICD-10-CM | POA: Diagnosis not present

## 2018-07-06 DIAGNOSIS — N400 Enlarged prostate without lower urinary tract symptoms: Secondary | ICD-10-CM | POA: Insufficient documentation

## 2018-07-06 DIAGNOSIS — N329 Bladder disorder, unspecified: Secondary | ICD-10-CM

## 2018-07-06 DIAGNOSIS — N201 Calculus of ureter: Secondary | ICD-10-CM

## 2018-07-06 DIAGNOSIS — Z79899 Other long term (current) drug therapy: Secondary | ICD-10-CM | POA: Diagnosis not present

## 2018-07-06 DIAGNOSIS — Z87442 Personal history of urinary calculi: Secondary | ICD-10-CM | POA: Insufficient documentation

## 2018-07-06 DIAGNOSIS — N210 Calculus in bladder: Secondary | ICD-10-CM | POA: Diagnosis not present

## 2018-07-06 DIAGNOSIS — D494 Neoplasm of unspecified behavior of bladder: Secondary | ICD-10-CM | POA: Diagnosis present

## 2018-07-06 DIAGNOSIS — N3081 Other cystitis with hematuria: Secondary | ICD-10-CM | POA: Diagnosis not present

## 2018-07-06 DIAGNOSIS — Z711 Person with feared health complaint in whom no diagnosis is made: Secondary | ICD-10-CM | POA: Diagnosis not present

## 2018-07-06 HISTORY — PX: CYSTOSCOPY/URETEROSCOPY/HOLMIUM LASER: SHX6545

## 2018-07-06 HISTORY — PX: CYSTOSCOPY WITH FULGERATION: SHX6638

## 2018-07-06 HISTORY — PX: CYSTOSCOPY W/ URETERAL STENT REMOVAL: SHX1430

## 2018-07-06 HISTORY — PX: CYSTOSCOPY W/ RETROGRADES: SHX1426

## 2018-07-06 HISTORY — PX: CYSTOSCOPY WITH BIOPSY: SHX5122

## 2018-07-06 HISTORY — PX: CYSTOSCOPY WITH LITHOLAPAXY: SHX1425

## 2018-07-06 SURGERY — CYSTOSCOPY, WITH BIOPSY
Anesthesia: General | Site: Ureter | Laterality: Right

## 2018-07-06 MED ORDER — EPHEDRINE SULFATE 50 MG/ML IJ SOLN
INTRAMUSCULAR | Status: AC
  Filled 2018-07-06: qty 1

## 2018-07-06 MED ORDER — DEXAMETHASONE SODIUM PHOSPHATE 10 MG/ML IJ SOLN
INTRAMUSCULAR | Status: AC
  Filled 2018-07-06: qty 1

## 2018-07-06 MED ORDER — FENTANYL CITRATE (PF) 100 MCG/2ML IJ SOLN
INTRAMUSCULAR | Status: DC | PRN
  Administered 2018-07-06 (×3): 50 ug via INTRAVENOUS

## 2018-07-06 MED ORDER — SUGAMMADEX SODIUM 200 MG/2ML IV SOLN
INTRAVENOUS | Status: DC | PRN
  Administered 2018-07-06: 284 mg via INTRAVENOUS

## 2018-07-06 MED ORDER — ONDANSETRON HCL 4 MG/2ML IJ SOLN
INTRAMUSCULAR | Status: AC
  Filled 2018-07-06: qty 2

## 2018-07-06 MED ORDER — LACTATED RINGERS IV SOLN
INTRAVENOUS | Status: DC
  Administered 2018-07-06 (×2): via INTRAVENOUS

## 2018-07-06 MED ORDER — MIDAZOLAM HCL 2 MG/2ML IJ SOLN
INTRAMUSCULAR | Status: AC
  Filled 2018-07-06: qty 2

## 2018-07-06 MED ORDER — SUCCINYLCHOLINE CHLORIDE 20 MG/ML IJ SOLN
INTRAMUSCULAR | Status: DC | PRN
  Administered 2018-07-06: 80 mg via INTRAVENOUS

## 2018-07-06 MED ORDER — SUGAMMADEX SODIUM 200 MG/2ML IV SOLN
INTRAVENOUS | Status: AC
  Filled 2018-07-06: qty 4

## 2018-07-06 MED ORDER — PROPOFOL 10 MG/ML IV BOLUS
INTRAVENOUS | Status: AC
  Filled 2018-07-06: qty 40

## 2018-07-06 MED ORDER — MIDAZOLAM HCL 2 MG/2ML IJ SOLN
INTRAMUSCULAR | Status: DC | PRN
  Administered 2018-07-06: 2 mg via INTRAVENOUS

## 2018-07-06 MED ORDER — LIDOCAINE HCL (CARDIAC) PF 100 MG/5ML IV SOSY
PREFILLED_SYRINGE | INTRAVENOUS | Status: DC | PRN
  Administered 2018-07-06: 80 mg via INTRAVENOUS

## 2018-07-06 MED ORDER — PHENYLEPHRINE HCL 10 MG/ML IJ SOLN
INTRAMUSCULAR | Status: DC | PRN
  Administered 2018-07-06: 100 ug via INTRAVENOUS
  Administered 2018-07-06: 200 ug via INTRAVENOUS
  Administered 2018-07-06: 100 ug via INTRAVENOUS

## 2018-07-06 MED ORDER — ONDANSETRON HCL 4 MG/2ML IJ SOLN: 4.0000 mg | Freq: Once | INTRAMUSCULAR | Status: DC | PRN

## 2018-07-06 MED ORDER — FENTANYL CITRATE (PF) 100 MCG/2ML IJ SOLN
INTRAMUSCULAR | Status: AC
  Filled 2018-07-06: qty 2

## 2018-07-06 MED ORDER — IOPAMIDOL (ISOVUE-M 200) INJECTION 41%
INTRAMUSCULAR | Status: DC | PRN
  Administered 2018-07-06: 20 mL

## 2018-07-06 MED ORDER — SUCCINYLCHOLINE CHLORIDE 20 MG/ML IJ SOLN
INTRAMUSCULAR | Status: AC
  Filled 2018-07-06: qty 1

## 2018-07-06 MED ORDER — ROCURONIUM BROMIDE 50 MG/5ML IV SOLN
INTRAVENOUS | Status: AC
  Filled 2018-07-06: qty 1

## 2018-07-06 MED ORDER — DEXAMETHASONE SODIUM PHOSPHATE 10 MG/ML IJ SOLN
INTRAMUSCULAR | Status: DC | PRN
  Administered 2018-07-06: 10 mg via INTRAVENOUS

## 2018-07-06 MED ORDER — ONDANSETRON HCL 4 MG/2ML IJ SOLN
INTRAMUSCULAR | Status: DC | PRN
  Administered 2018-07-06: 4 mg via INTRAVENOUS

## 2018-07-06 MED ORDER — FENTANYL CITRATE (PF) 100 MCG/2ML IJ SOLN: 25.0000 ug | INTRAMUSCULAR | Status: DC | PRN

## 2018-07-06 MED ORDER — EPHEDRINE SULFATE 50 MG/ML IJ SOLN
INTRAMUSCULAR | Status: DC | PRN
  Administered 2018-07-06: 5 mg via INTRAVENOUS

## 2018-07-06 MED ORDER — PROPOFOL 10 MG/ML IV BOLUS
INTRAVENOUS | Status: DC | PRN
  Administered 2018-07-06: 120 mg via INTRAVENOUS

## 2018-07-06 MED ORDER — FAMOTIDINE 20 MG PO TABS
ORAL_TABLET | ORAL | Status: AC
  Administered 2018-07-06: 20 mg via ORAL
  Filled 2018-07-06: qty 1

## 2018-07-06 MED ORDER — ROCURONIUM BROMIDE 100 MG/10ML IV SOLN
INTRAVENOUS | Status: DC | PRN
  Administered 2018-07-06: 25 mg via INTRAVENOUS
  Administered 2018-07-06: 10 mg via INTRAVENOUS
  Administered 2018-07-06: 5 mg via INTRAVENOUS

## 2018-07-06 MED ORDER — FAMOTIDINE 20 MG PO TABS
20.0000 mg | ORAL_TABLET | Freq: Once | ORAL | Status: AC
  Administered 2018-07-06: 20 mg via ORAL

## 2018-07-06 MED ORDER — LIDOCAINE HCL (PF) 2 % IJ SOLN
INTRAMUSCULAR | Status: AC
  Filled 2018-07-06: qty 10

## 2018-07-06 SURGICAL SUPPLY — 39 items
BAG DRAIN CYSTO-URO LG1000N (MISCELLANEOUS) ×5 IMPLANT
BRUSH SCRUB EZ  4% CHG (MISCELLANEOUS) ×2
BRUSH SCRUB EZ 1% IODOPHOR (MISCELLANEOUS) ×5 IMPLANT
BRUSH SCRUB EZ 4% CHG (MISCELLANEOUS) ×3 IMPLANT
BULB IRRIG PATHFIND (MISCELLANEOUS) IMPLANT
CATH URETL 5X70 OPEN END (CATHETERS) IMPLANT
CNTNR SPEC 2.5X3XGRAD LEK (MISCELLANEOUS)
CONT SPEC 4OZ STER OR WHT (MISCELLANEOUS)
CONTAINER SPEC 2.5X3XGRAD LEK (MISCELLANEOUS) IMPLANT
DRAPE UTILITY 15X26 TOWEL STRL (DRAPES) ×5 IMPLANT
DRSG TELFA 4X3 1S NADH ST (GAUZE/BANDAGES/DRESSINGS) ×5 IMPLANT
ELECT REM PT RETURN 9FT ADLT (ELECTROSURGICAL) ×5
ELECTRODE REM PT RTRN 9FT ADLT (ELECTROSURGICAL) ×3 IMPLANT
FIBER LASER 550 (Laser) ×5 IMPLANT
GLOVE BIOGEL PI IND STRL 7.5 (GLOVE) ×3 IMPLANT
GLOVE BIOGEL PI INDICATOR 7.5 (GLOVE) ×2
GOWN STRL REUS W/ TWL LRG LVL3 (GOWN DISPOSABLE) ×6 IMPLANT
GOWN STRL REUS W/ TWL XL LVL3 (GOWN DISPOSABLE) ×3 IMPLANT
GOWN STRL REUS W/TWL LRG LVL3 (GOWN DISPOSABLE) ×4
GOWN STRL REUS W/TWL XL LVL3 (GOWN DISPOSABLE) ×2
INFUSOR MANOMETER BAG 3000ML (MISCELLANEOUS) ×5 IMPLANT
INTRODUCER DILATOR DOUBLE (INTRODUCER) IMPLANT
KIT TURNOVER CYSTO (KITS) ×5 IMPLANT
PACK CYSTO AR (MISCELLANEOUS) ×5 IMPLANT
SENSORWIRE 0.038 NOT ANGLED (WIRE) ×10
SET CYSTO W/LG BORE CLAMP LF (SET/KITS/TRAYS/PACK) ×5 IMPLANT
SHEATH URETERAL 12FRX35CM (MISCELLANEOUS) IMPLANT
SOL .9 NS 3000ML IRR  AL (IV SOLUTION) ×2
SOL .9 NS 3000ML IRR UROMATIC (IV SOLUTION) ×3 IMPLANT
STENT URET 6FRX24 CONTOUR (STENTS) IMPLANT
STENT URET 6FRX26 CONTOUR (STENTS) IMPLANT
SURGILUBE 2OZ TUBE FLIPTOP (MISCELLANEOUS) ×5 IMPLANT
SYR 10ML LL (SYRINGE) ×5 IMPLANT
SYRINGE IRR TOOMEY STRL 70CC (SYRINGE) ×5 IMPLANT
TUBING ART PRESS 48 MALE/FEM (TUBING) IMPLANT
VALVE UROSEAL ADJ ENDO (VALVE) ×5 IMPLANT
WATER STERILE IRR 1000ML POUR (IV SOLUTION) ×5 IMPLANT
WATER STERILE IRR 3000ML UROMA (IV SOLUTION) ×10 IMPLANT
WIRE SENSOR 0.038 NOT ANGLED (WIRE) ×6 IMPLANT

## 2018-07-06 NOTE — Op Note (Signed)
Date of procedure: 07/06/18  Preoperative diagnosis:  1. Right proximal ureteral stone, bladder stone, bladder tumor  Postoperative diagnosis:  1. Same  Procedure: 1. Cystoscopy, bladder biopsy and fulguration 2. Right ureteroscopy, retrograde pyelogram with intraoperative interpretation 3. Cystolitholapaxy  Surgeon: Nickolas Madrid, MD  Anesthesia: General  Complications: None  Intraoperative findings:  1.  Small 5 mm papillary appearing bladder lesion removed with cold cup biopsy forceps and fulgurated 2.  Diagnostic right ureteroscopy with no residual right ureteral or renal stone 3.  Right retrograde pyelogram with no filling defects noted, contrast drained rapidly 4.  Laser cystolitholapaxy and irrigation of fragments  EBL: Minimal  Specimens: Bladder lesion  Drains: None  Indication: Steven Coleman is a 67 y.o. patient that previously presented with fever over 101.3 and blood cultures positive for enterococcus.  He was found of a 5 mm proximal ureteral stone and underwent urgent ureteral stent placement at that time.  Cystoscopy at that time showed a very small 5 mm bladder lesion that we plan to biopsy in follow-up.  He also had a 2 cm bladder stone.  After reviewing the management options for treatment, they elected to proceed with the above surgical procedure(s). We have discussed the potential benefits and risks of the procedure, side effects of the proposed treatment, the likelihood of the patient achieving the goals of the procedure, and any potential problems that might occur during the procedure or recuperation. Informed consent has been obtained.  Description of procedure:  The patient was taken to the operating room and general anesthesia was induced.  The patient was placed in the dorsal lithotomy position, prepped and draped in the usual sterile fashion, and preoperative antibiotics(ampicillin/gentamicin) were administered. A preoperative time-out was performed.    A 21 French rigid cystoscope was used to intubate the urethra.  The prostate was very large with a significant median lobe component.  We immediately identified a very small approximately 5 mm papillary lesion just superior to the right ureteral orifice.  This was removed entirely with a cold cup biopsy forcep and fulgurated.  We then passed a sensor wire alongside the ureteral stent up to the renal pelvis under fluoroscopy.  We then reentered the bladder with the rigid scope and the ureteral stent was grasped and pulled to the meatus.  An additional safety wire was used to intubate this stent and the stent was removed.  The flexible ureteroscope was advanced over the wire under fluoroscopic vision.  Thorough pyeloscopy was performed which revealed no urolithiasis.  Careful pullback of the proximal ureter did not reveal any ureteral stones.  I again passed the scope back up into the kidney and thorough pyeloscopy again did not demonstrate any renal stones.  I injected contrast from the UPJ which demonstrated no filling defects.  Again, I used the pyelogram as a roadmap and explored every calyx and no stone was found.  Careful pullback ureteroscopy demonstrated no ureteral stones.  The rigid cystoscope was replaced and I performed a retrograde pyelogram with a 5 French access catheter that showed no filling defects in the right ureter or collecting system.  5 minutes later fluoroscopy showed complete drainage of the contrast.  Excellent reflux was seen from the right ureter in the bladder.  I then used a 500 m laser fiber on settings of 1.5 J and 15 Hz to fragment the bladder stone to dust.  The pieces were then irrigated free until no residual fragments remained.  At the conclusion of the procedure there  was no active bleeding noted and urine was clear.   Disposition: Stable to PACU  Plan: -Follow-up bladder biopsy pathology.  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 -Follow-up in clinic in 2 to 3 weeks to discuss pathology as well as his voiding symptoms.  He does have a history of a prostatic artery embolization. -Continue Flomax for voiding symptoms  Nickolas Madrid, MD

## 2018-07-06 NOTE — Anesthesia Post-op Follow-up Note (Signed)
Anesthesia QCDR form completed.        

## 2018-07-06 NOTE — Anesthesia Postprocedure Evaluation (Signed)
Anesthesia Post Note  Patient: Steven Coleman  Procedure(s) Performed: CYSTOSCOPY WITH  Bladder BIOPSY (N/A Bladder) CYSTOSCOPY WITH FULGERATION (N/A Bladder) CYSTOSCOPY WITH LITHOLAPAXY (N/A Bladder) CYSTOSCOPY WITH RETROGRADE PYELOGRAM (Right Ureter) CYSTOSCOPY/URETEROSCOPY/HOLMIUM LASER (Right Ureter) CYSTOSCOPY WITH STENT REMOVAL (Right Ureter)  Patient location during evaluation: PACU Anesthesia Type: General Level of consciousness: awake and alert Pain management: pain level controlled Vital Signs Assessment: post-procedure vital signs reviewed and stable Respiratory status: spontaneous breathing, nonlabored ventilation, respiratory function stable and patient connected to nasal cannula oxygen Cardiovascular status: blood pressure returned to baseline and stable Postop Assessment: no apparent nausea or vomiting Anesthetic complications: no     Last Vitals:  Vitals:   07/06/18 1145 07/06/18 1151  BP: 133/75 128/69  Pulse: 65 73  Resp: 16 16  Temp: (!) 36.4 C   SpO2: 100% 100%    Last Pain:  Vitals:   07/06/18 1145  PainSc: 0-No pain                 Maziah Smola S

## 2018-07-06 NOTE — OR Nursing (Signed)
Discussed discharge instructions with pt and wife. Both voice understanding. 

## 2018-07-06 NOTE — Anesthesia Preprocedure Evaluation (Signed)
Anesthesia Evaluation  Patient identified by MRN, date of birth, ID band Patient awake    Reviewed: Allergy & Precautions, NPO status , Patient's Chart, lab work & pertinent test results, reviewed documented beta blocker date and time   Airway Mallampati: II  TM Distance: >3 FB     Dental  (+) Chipped   Pulmonary           Cardiovascular      Neuro/Psych    GI/Hepatic   Endo/Other    Renal/GU      Musculoskeletal   Abdominal   Peds  Hematology   Anesthesia Other Findings   Reproductive/Obstetrics                             Anesthesia Physical Anesthesia Plan  ASA: II  Anesthesia Plan: General   Post-op Pain Management:    Induction: Intravenous  PONV Risk Score and Plan:   Airway Management Planned: Oral ETT  Additional Equipment:   Intra-op Plan:   Post-operative Plan:   Informed Consent: I have reviewed the patients History and Physical, chart, labs and discussed the procedure including the risks, benefits and alternatives for the proposed anesthesia with the patient or authorized representative who has indicated his/her understanding and acceptance.     Plan Discussed with: CRNA  Anesthesia Plan Comments:         Anesthesia Quick Evaluation

## 2018-07-06 NOTE — Transfer of Care (Signed)
Immediate Anesthesia Transfer of Care Note  Patient: Steven Coleman  Procedure(s) Performed: CYSTOSCOPY WITH  Bladder BIOPSY (N/A Bladder) CYSTOSCOPY WITH FULGERATION (N/A Bladder) CYSTOSCOPY WITH LITHOLAPAXY (N/A Bladder) CYSTOSCOPY WITH RETROGRADE PYELOGRAM (Right Ureter) CYSTOSCOPY/URETEROSCOPY/HOLMIUM LASER (Right Ureter) CYSTOSCOPY WITH STENT REMOVAL (Right Ureter)  Patient Location: PACU  Anesthesia Type:General  Level of Consciousness: awake  Airway & Oxygen Therapy: Patient Spontanous Breathing and Patient connected to face mask  Post-op Assessment: Report given to RN and Post -op Vital signs reviewed and stable  Post vital signs: stable  Last Vitals:  Vitals Value Taken Time  BP 128/65 07/06/2018 10:50 AM  Temp 36.6 C 07/06/2018 10:50 AM  Pulse 84 07/06/2018 10:56 AM  Resp 18 07/06/2018 10:56 AM  SpO2 95 % 07/06/2018 10:56 AM  Vitals shown include unvalidated device data.  Last Pain:  Vitals:   07/06/18 1050  PainSc: 0-No pain         Complications: No apparent anesthesia complications

## 2018-07-06 NOTE — Anesthesia Procedure Notes (Addendum)
Procedure Name: Intubation Date/Time: 07/06/2018 9:29 AM Performed by: Lavone Orn, CRNA Pre-anesthesia Checklist: Patient identified, Emergency Drugs available, Suction available, Patient being monitored and Timeout performed Patient Re-evaluated:Patient Re-evaluated prior to induction Oxygen Delivery Method: Circle system utilized Preoxygenation: Pre-oxygenation with 100% oxygen Induction Type: IV induction Laryngoscope Size: McGraph and 4 Grade View: Grade II Tube size: 7.5 mm Number of attempts: 1 Airway Equipment and Method: Video-laryngoscopy and Stylet Placement Confirmation: ETT inserted through vocal cords under direct vision,  positive ETCO2,  CO2 detector and breath sounds checked- equal and bilateral Secured at: 23 cm Tube secured with: Tape Dental Injury: Teeth and Oropharynx as per pre-operative assessment

## 2018-07-06 NOTE — H&P (Signed)
    9:02 AM   Steven Coleman [birth year] [id number]   HPI: Steven Coleman is here today for definitive management of a right proximal ureteral stone, bladder stone, and possible small bladder lesion after undergoing emergent right ureteral stent placement for fever with enterococcus UTI and right-sided hydronephrosis.  He denies any fevers or chills currently.  Denies chest pain or shortness of breath.  Urine culture 06/28/2018 no growth.  PMH: Past Medical History:  Diagnosis Date  . BPH (benign prostatic hyperplasia)   . History of kidney stones     Surgical History: Past Surgical History:  Procedure Laterality Date  . CYSTOSCOPY WITH STENT PLACEMENT Right 05/29/2018   Procedure: CYSTOSCOPY WITH STENT PLACEMENT;  Surgeon: Billey Co, MD;  Location: [facility] ORS;  Service: Urology;  Laterality: Right;  . EYE SURGERY Bilateral 2014   blepharoplasty  . 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 prostatic artery embolization  2018   while at [facility]  . SHOULDER SURGERY Left 1982   arthroscopy  . TONSILLECTOMY AND ADENOIDECTOMY     as a child    Allergies: No Known Allergies  Family History: Family History  Problem Relation Age of Onset  . Cancer Mother        lung  . Cancer Father        throat  . Prostate cancer Neg Hx   . Kidney cancer Neg Hx   . Bladder Cancer Neg Hx     Social History:  reports that he has never smoked. He has never used smokeless tobacco. He reports that he drinks about 1.0 standard drinks of alcohol per week. He reports that he does not use drugs.  ROS: Please see flowsheet from today's date for complete review of systems.  Physical Exam: BP 134/90   Pulse 86   Temp 97.8 F (36.6 C)   Resp 16   Ht 5\' 9"  (1.753 m)   Wt 71 kg   SpO2 98%   BMI 23.12 kg/m    Constitutional:  Alert and oriented, No acute distress. Cardiovascular: Regular rate and rhythm Respiratory: Clear to auscultation bilaterally GI: Abdomen is soft, nontender,  nondistended, no abdominal masses Lymph: No cervical or inguinal lymphadenopathy. Skin: No rashes, bruises or suspicious lesions. Neurologic: Grossly intact, no focal deficits, moving all 4 extremities. Psychiatric: Normal mood and affect.  Laboratory Data: Urine culture 11/7 no growth  Assessment & Plan:   In summary, [name] is a 67 year old male status post urgent right ureteral stent placement for an obstructing infected right 5 mm proximal ureteral stone.  At time of stent placement, he was found of a 2 cm bladder stone as well as a small possible bladder lesion.  Informed consent obtained for cystolitholapaxy, right ureteroscopy, laser lithotripsy, stent exchange, bladder biopsy and fulguration.  We discussed the risks of bleeding, infection, need for additional procedures, and stent related symptoms.  Billey Co, [facility] [address] [place], [place] [address] [phone]

## 2018-07-06 NOTE — Discharge Instructions (Signed)

## 2018-07-09 LAB — SURGICAL PATHOLOGY

## 2018-07-16 ENCOUNTER — Ambulatory Visit (INDEPENDENT_AMBULATORY_CARE_PROVIDER_SITE_OTHER): Payer: Medicare Other | Admitting: Urology

## 2018-07-16 ENCOUNTER — Encounter: Payer: Self-pay | Admitting: Urology

## 2018-07-16 VITALS — BP 166/90 | HR 67 | Ht 69.5 in | Wt 164.6 lb

## 2018-07-16 DIAGNOSIS — N401 Enlarged prostate with lower urinary tract symptoms: Secondary | ICD-10-CM | POA: Diagnosis not present

## 2018-07-16 DIAGNOSIS — N210 Calculus in bladder: Secondary | ICD-10-CM

## 2018-07-16 NOTE — Progress Notes (Signed)
   07/16/2018 9:39 AM   [name] 1950-09-13 [id number]  Reason for visit: Follow up bladder biopsy, right ureteroscopy, cystolitholapaxy  HPI: I saw Mr. Beckers back in urology clinic today.  He is a 67 year old male with a 150 g prostate with history of prostatic artery embolization over a year ago.  He remains on Flomax.  He does occasionally self catheterize at home, however he has not had to catheterize over the last month.  On 07/06/2018 he underwent cystoscopy, biopsy of a small 5 mm lesion that was ultimately benign, cystolitholapaxy of a 2 cm bladder stone, and right ureteroscopy for a previously stented 5 mm infected stone.  No stone was found at the time of diagnostic ureteroscopy, retrograde pyelogram was normal, and stent was not left.  Since surgery he is doing very well and reports a strong stream with clear yellow urine.  No aggravating or alleviating factors.  Severity is mild.   ROS: Please see flowsheet from today's date for complete review of systems.  Physical Exam: BP (!) 166/90 (BP Location: Left Arm, Patient Position: Sitting, Cuff Size: Normal)   Pulse 67   Ht 5' 9.5" (1.765 m)   Wt 164 lb 9.6 oz (74.7 kg)   BMI 23.96 kg/m    Constitutional:  Alert and oriented, No acute distress. Respiratory: Normal respiratory effort, no increased work of breathing. GI: Abdomen is soft, nontender, nondistended, no abdominal masses GU: No CVA tenderness Skin: No rashes, bruises or suspicious lesions. Neurologic: Grossly intact, no focal deficits, moving all 4 extremities. Psychiatric: Normal mood and affect  Laboratory Data: None to review  Pertinent Imaging: None to review  Assessment & Plan:   In summary, Mr. Haymond is a 67 year old male with a large 150 g prostate with history of prostatic artery embolization over a year ago.  I initially met him on 05/29/2018 when I placed a right ureteral stent for an infected obstructing 5 mm ureteral stone.  On 07/06/2018 he  underwent cystolitholapaxy of a 2 cm stone, biopsy of a small benign bladder lesion, and diagnostic right ureteroscopy with no residual stone seen.  He is doing very well and voiding with a strong stream on Flomax  Follow-up in 1 year with PVR Continue [place], MD  [facility] [address] [place], [place] [address] [phone]

## 2018-09-03 ENCOUNTER — Other Ambulatory Visit: Payer: Self-pay

## 2018-09-03 ENCOUNTER — Other Ambulatory Visit: Payer: Self-pay | Admitting: Urology

## 2018-09-03 MED ORDER — TAMSULOSIN HCL 0.4 MG PO CAPS: 0.4000 mg | ORAL_CAPSULE | Freq: Every day | ORAL | 11 refills | Status: DC

## 2018-09-03 NOTE — Telephone Encounter (Signed)
Pt lmom asking for a refill of his Flomax, pt did not leave Rx details nor pharmacy of choice. Please advise. Thanks

## 2018-09-04 MED ORDER — TAMSULOSIN HCL 0.4 MG PO CAPS: 0.4000 mg | ORAL_CAPSULE | Freq: Every day | ORAL | 11 refills | Status: DC

## 2018-09-04 NOTE — Telephone Encounter (Signed)
Pt needs a refill for Flomax.

## 2018-09-04 NOTE — Telephone Encounter (Signed)
Script refilled

## 2018-11-19 ENCOUNTER — Other Ambulatory Visit: Payer: Self-pay | Admitting: Family Medicine

## 2018-11-19 MED ORDER — TAMSULOSIN HCL 0.4 MG PO CAPS: 0.4000 mg | ORAL_CAPSULE | Freq: Every day | ORAL | 11 refills | Status: DC

## 2018-12-25 ENCOUNTER — Telehealth: Payer: Self-pay | Admitting: Radiology

## 2018-12-25 NOTE — Telephone Encounter (Signed)
LMOM. Need to schedule Follow-up in 1 year with PVR with Dr Diamantina Providence.

## 2019-09-12 IMAGING — CT CT RENAL STONE PROTOCOL
2 of 5 series · 16 of 46 positions shown, 18 images · non-contrast
Comparison: Renal ultrasound 06/03/2016

CLINICAL DATA: Flank pain.  Fever.  Positive blood cultures.

EXAM:
CT ABDOMEN AND PELVIS WITHOUT CONTRAST
TECHNIQUE: Multidetector CT imaging of the abdomen and pelvis was performed
following the standard protocol without IV contrast.

[Series 2: stone full standard · axial · 0.70mm/px · z∈[-530,-115]mm · 13 of 97 slices shown, 15 images]
[im 7/97  soft-tissue]
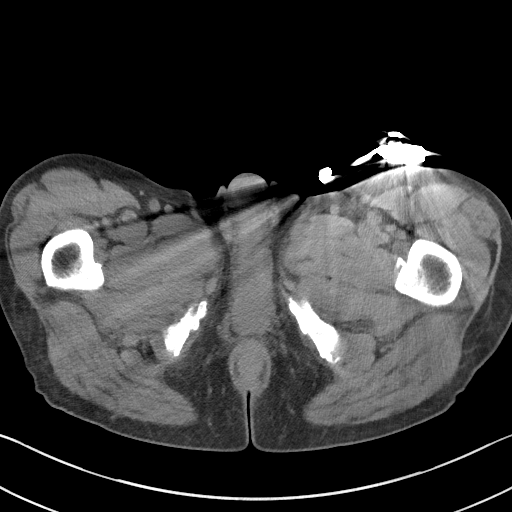
[im 7/97  bone]
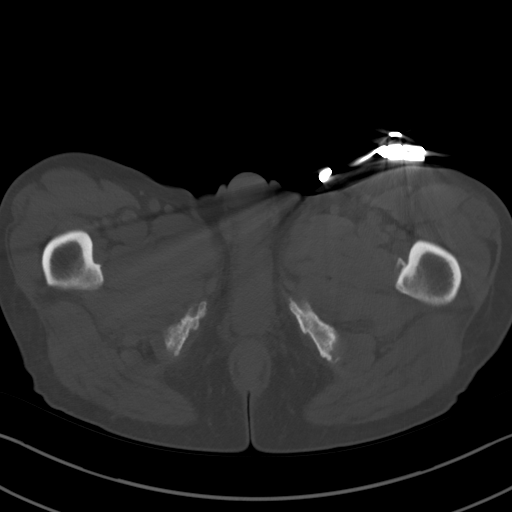
[im 14/97  soft-tissue]
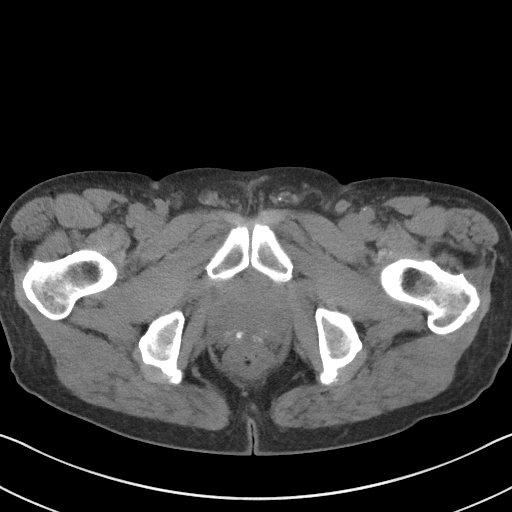
[im 21/97  soft-tissue]
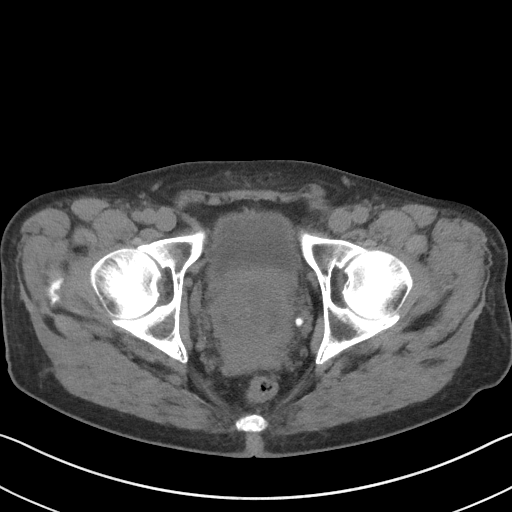
[im 28/97  soft-tissue]
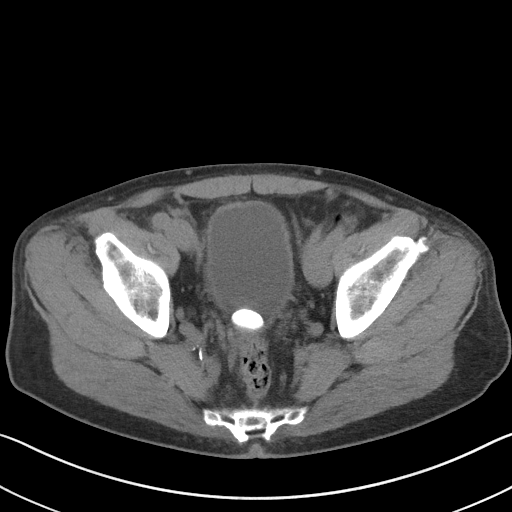
[im 35/97  soft-tissue]
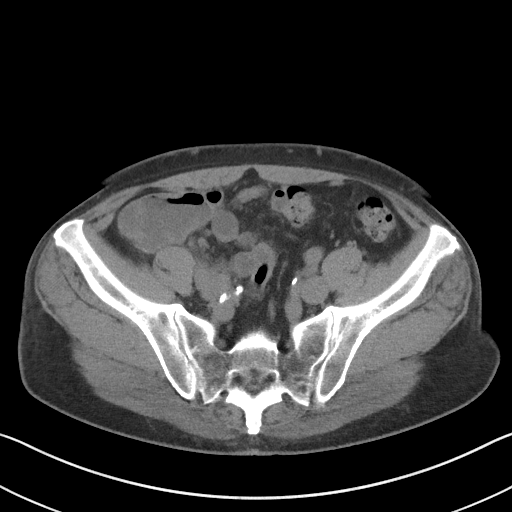
[im 42/97  soft-tissue]
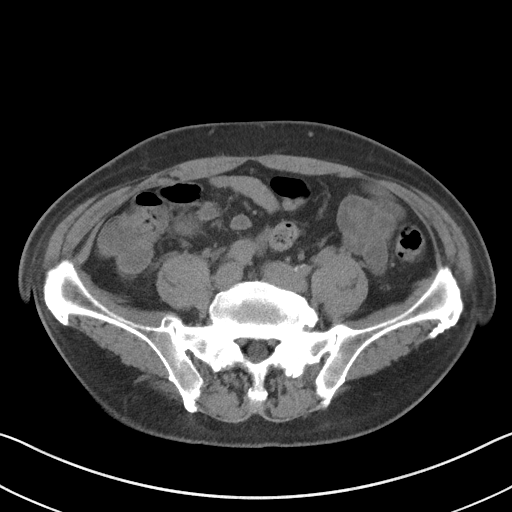
[im 49/97  soft-tissue]
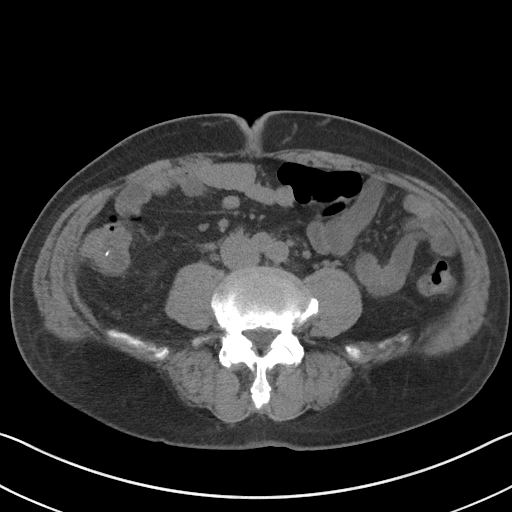
[im 55/97  soft-tissue]
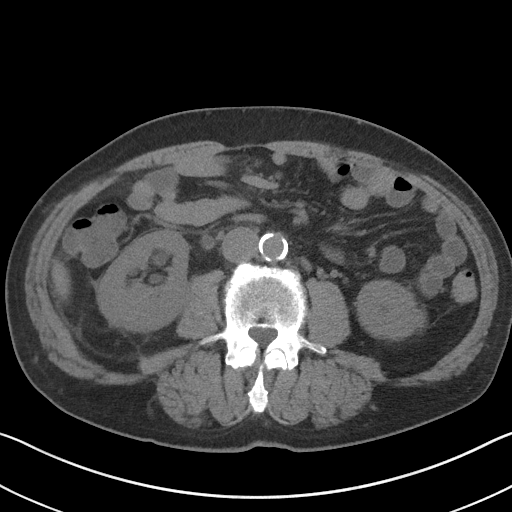
[im 62/97  soft-tissue]
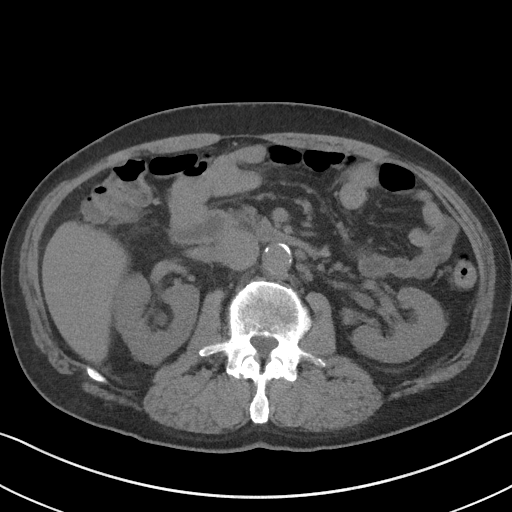
[im 62/97  bone]
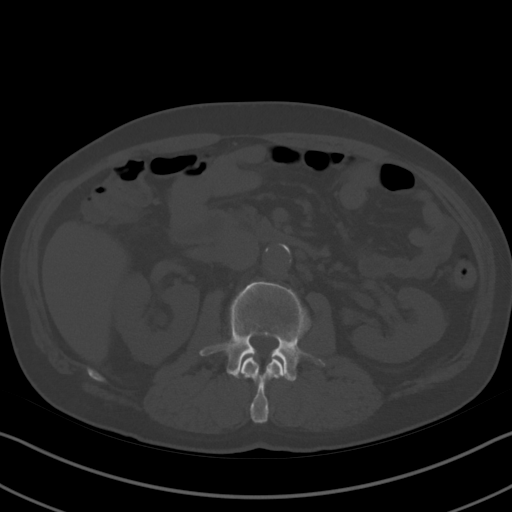
[im 69/97  soft-tissue]
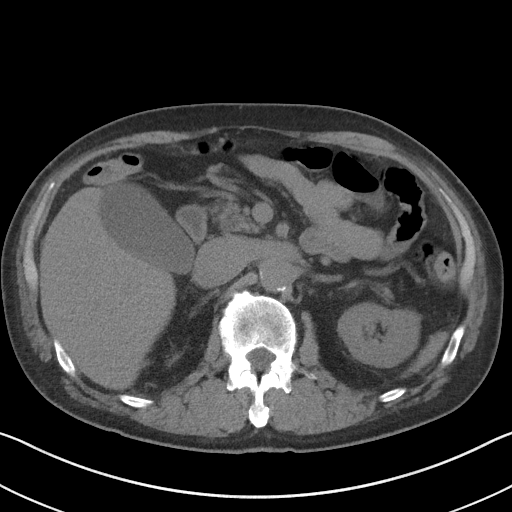
[im 76/97  soft-tissue]
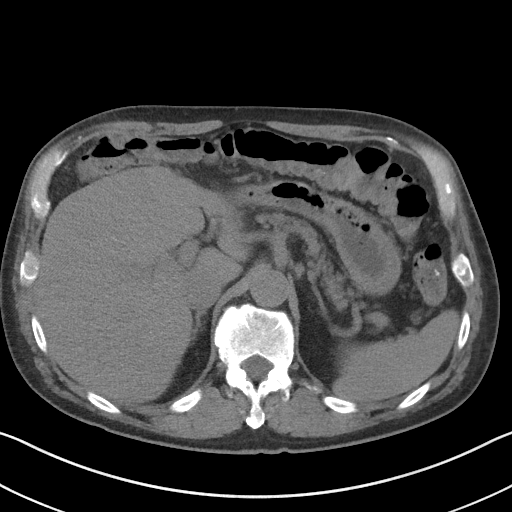
[im 83/97  soft-tissue]
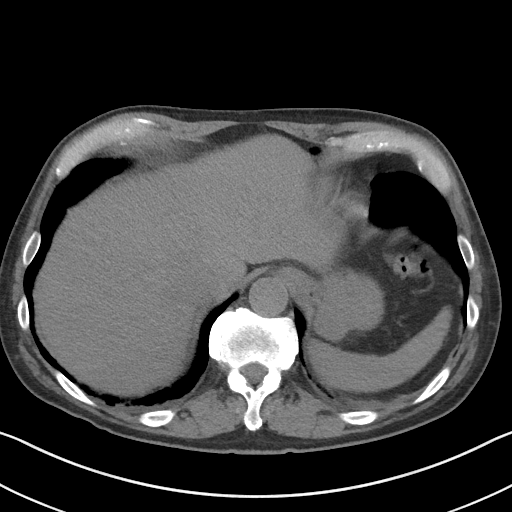
[im 90/97  soft-tissue]
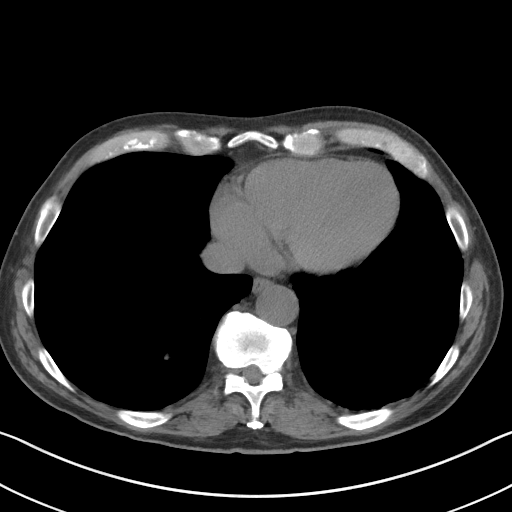

[Series 6: coronal · coronal · 0.75mm/px · 3 of 122 slices shown]
[im 41/122  soft-tissue]
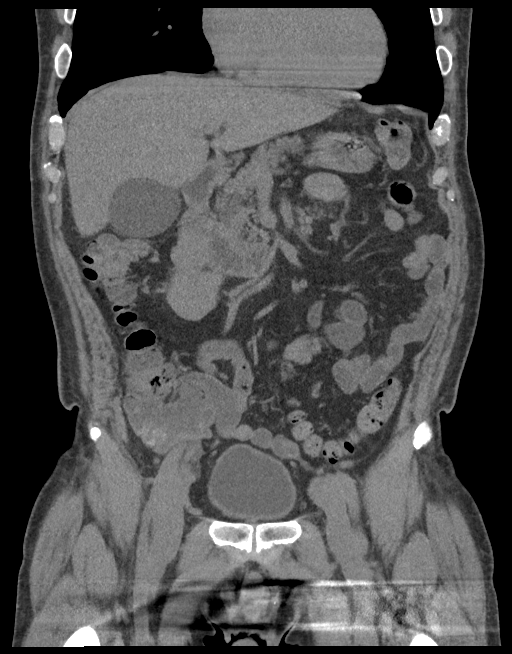
[im 54/122  soft-tissue]
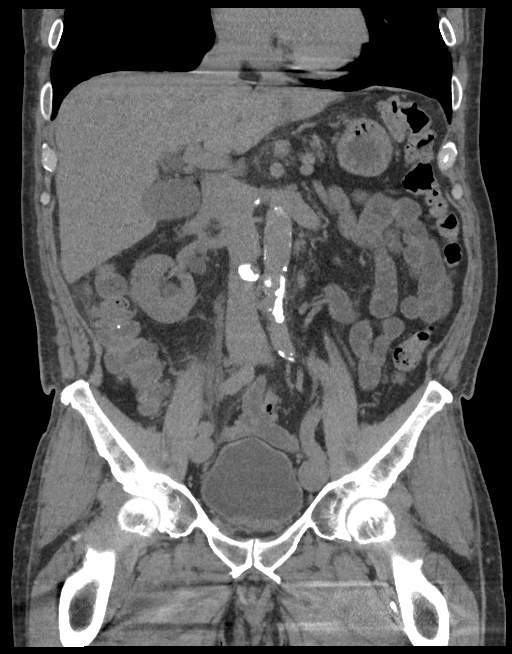
[im 68/122  soft-tissue]
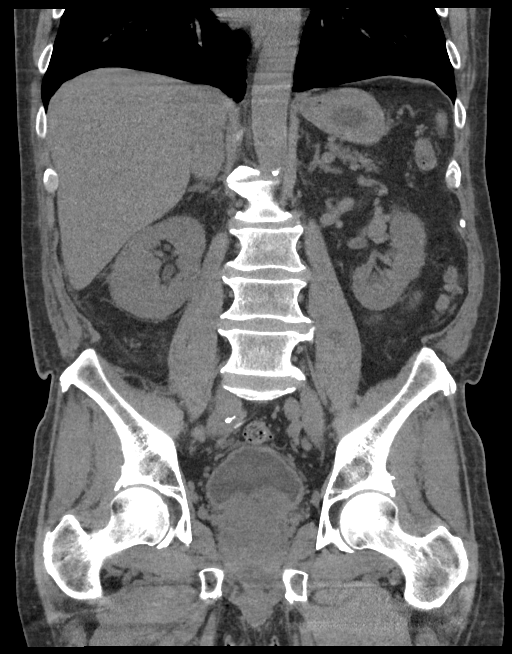

[16 of 46 positions shown; findings below may reference images not displayed]

FINDINGS: Lower chest: No acute abnormality.

Hepatobiliary: No focal liver abnormality is seen. No gallstones,
gallbladder wall thickening, or biliary dilatation.

Pancreas: Atrophic pancreas without edema or mass

Spleen: Negative

Adrenals/Urinary Tract: Mild right hydronephrosis. 5 mm calculus in
the proximal right ureter causing obstruction. No left renal calculi
or hydronephrosis. No other right renal calculi. No renal mass or
infarct.

Large bladder calculus 17 x 26 mm.  No bladder wall thickening.

Stomach/Bowel: Negative for bowel obstruction. No bowel mass or
edema. Appendix not well visualized.

Vascular/Lymphatic: Atherosclerotic calcification. Negative for
aortic aneurysm. No lymphadenopathy

Reproductive: Marked prostate enlargement 59 x 64 mm in diameter.
Prostate protrudes into the urinary bladder.

Other: None

Musculoskeletal: No acute skeletal abnormality. Lumbar spine
degenerative changes.
IMPRESSION: 1. 5 mm stone proximal right ureter causing mild hydronephrosis.
2. Large bladder calculus 17 x 26 mm.  Marked prostate enlargement.
3. Atherosclerotic aorta

## 2019-12-16 ENCOUNTER — Ambulatory Visit (INDEPENDENT_AMBULATORY_CARE_PROVIDER_SITE_OTHER): Payer: Medicare Other | Admitting: Urology

## 2019-12-16 ENCOUNTER — Encounter: Payer: Self-pay | Admitting: Urology

## 2019-12-16 ENCOUNTER — Other Ambulatory Visit: Payer: Self-pay

## 2019-12-16 VITALS — BP 163/84 | HR 92 | Ht 69.5 in | Wt 173.8 lb

## 2019-12-16 DIAGNOSIS — N138 Other obstructive and reflux uropathy: Secondary | ICD-10-CM

## 2019-12-16 DIAGNOSIS — N200 Calculus of kidney: Secondary | ICD-10-CM | POA: Diagnosis not present

## 2019-12-16 DIAGNOSIS — N401 Enlarged prostate with lower urinary tract symptoms: Secondary | ICD-10-CM | POA: Diagnosis not present

## 2019-12-16 LAB — BLADDER SCAN AMB NON-IMAGING

## 2019-12-16 MED ORDER — TAMSULOSIN HCL 0.4 MG PO CAPS: 0.4000 mg | ORAL_CAPSULE | Freq: Every day | ORAL | 3 refills | Status: DC

## 2019-12-16 NOTE — Progress Notes (Signed)
   12/16/2019 10:03 AM   [name] 1950/12/28 [id number]  Reason for visit: Follow up BPH, bladder stones, nephrolithiasis  HPI: I saw Mr. Gerlene Fee back in urology clinic today for follow-up of the above issues.  He is a very healthy 69 year old male with a history of a 150 g prostate who underwent prostatic artery embolization at [facility] in 2018.  He remains on Flomax but is doing very well from a BPH perspective.  He no longer is having to self catheterize, and denies any UTIs over the last year.  He reports he voids with a good stream and denies any incontinence.  He has nocturia 0-1 time per night.  PVR is normal in clinic today at 103 mL.  He has a history of cystolitholapaxy in November 2019 with me for a 2 cm bladder stone.  His last stone event was also in November 2019 with a 6 mm right ureteral stone that required urgent stenting for infection.  There was no other nephrolithiasis on CT at that time.  He denies any flank pain or gross hematuria since that time.  Overall, he feels he has been doing very well over the last year with no UTIs, worsening urinary symptoms, or stone events.   Physical Exam: BP (!) 163/84 (BP Location: Left Arm, Patient Position: Sitting, Cuff Size: Normal)   Pulse 92   Ht 5' 9.5" (1.765 m)   Wt 173 lb 12.8 oz (78.8 kg)   BMI 25.30 kg/m    Constitutional:  Alert and oriented, No acute distress.   Assessment & Plan:   In summary, is a very healthy 69 year old male with urologic history notable for BPH and bladder stones with 150 g prostate status post prostatic artery embolization at [facility] in 2018, and cystolitholapaxy with me in November 2019, as well as history of nephrolithiasis requiring ureteroscopy in the past.  He is doing very well over the last year with no new complaints today.  We discussed general stone prevention strategies including adequate hydration with goal of producing 2.5 L of urine daily, increasing citric acid intake, increasing calcium  intake during high oxalate meals, minimizing animal protein, and decreasing salt intake. Information about dietary recommendations given today.   Continue Flomax RTC 1 year with PVR   Billey Co, MD  [facility] [address], [place], [place] [address] [phone]

## 2019-12-16 NOTE — Patient Instructions (Signed)

## 2020-02-21 ENCOUNTER — Telehealth: Payer: Self-pay

## 2020-02-21 ENCOUNTER — Ambulatory Visit (INDEPENDENT_AMBULATORY_CARE_PROVIDER_SITE_OTHER): Payer: Medicare Other | Admitting: Physician Assistant

## 2020-02-21 ENCOUNTER — Other Ambulatory Visit: Payer: Self-pay

## 2020-02-21 ENCOUNTER — Encounter: Payer: Self-pay | Admitting: Physician Assistant

## 2020-02-21 VITALS — BP 165/88 | HR 90 | Temp 98.3°F | Ht 69.5 in | Wt 173.0 lb

## 2020-02-21 DIAGNOSIS — R300 Dysuria: Secondary | ICD-10-CM | POA: Diagnosis not present

## 2020-02-21 DIAGNOSIS — R310 Gross hematuria: Secondary | ICD-10-CM

## 2020-02-21 LAB — URINALYSIS, COMPLETE: Specific Gravity, UA: <1.005 — ABNORMAL LOW (ref 1.005–1.030)

## 2020-02-21 LAB — BLADDER SCAN AMB NON-IMAGING: Scan Result: 118

## 2020-02-21 LAB — MICROSCOPIC EXAMINATION: RBC: >30 /hpf — AB (ref 0–2)

## 2020-02-21 NOTE — Progress Notes (Signed)
02/21/2020 11:59 AM   Steven Coleman 1950/11/22 [id number]  CC: Chief Complaint  Patient presents with  . Dysuria    HPI: Steven Coleman is a 69 y.o. male with PMH BPH on Flomax s/p prostatic artery embolization in 2018, bladder stones s/p cystolitholapaxy in 2019, nephrolithiasis s/p right ureteral stent and diagnostic ureteroscopy in 2019, and benign polypoid cystitis s/p biopsy in 2019 who presents today for evaluation of possible UTI. He is an established [facility] patient who last saw Dr. Diamantina Providence on 12/16/2019 for routine follow-up of the above.  PVR 103 mL at that time.  Today, patient reports a 1-day history of dysuria, frequency, and the sensation of incomplete bladder emptying. He reports he may have been dehydrated yesterday and has been pushing fluids with improvement in dysuria.   Patient previously self catheterized and was able to discontinue this due to adequate voiding, however he self cathed once yesterday and once today. He had some pink-tinged urinary output yesterday after CICing the first time with worsening in gross hematuria after CICing this morning. He reports a history of occasional GH with CIC.  In-office UA today unable to read due to pigment interference with gross hematuria; urine microscopy with >30 RBCs/HPF. PVR 140mL.  PMH: Past Medical History:  Diagnosis Date  . BPH (benign prostatic hyperplasia)   . History of kidney stones     Surgical History: Past Surgical History:  Procedure Laterality Date  . CYSTOSCOPY W/ RETROGRADES Right 07/06/2018   Procedure: CYSTOSCOPY WITH RETROGRADE PYELOGRAM;  Surgeon: Billey Co, MD;  Location: [facility] ORS;  Service: Urology;  Laterality: Right;  . CYSTOSCOPY W/ URETERAL STENT REMOVAL Right 07/06/2018   Procedure: CYSTOSCOPY WITH STENT REMOVAL;  Surgeon: Billey Co, MD;  Location: [facility] ORS;  Service: Urology;  Laterality: Right;  . CYSTOSCOPY WITH BIOPSY N/A 07/06/2018   Procedure: CYSTOSCOPY WITH   Bladder BIOPSY;  Surgeon: Billey Co, MD;  Location: [facility] ORS;  Service: Urology;  Laterality: N/A;  . CYSTOSCOPY WITH FULGERATION N/A 07/06/2018   Procedure: CYSTOSCOPY WITH FULGERATION;  Surgeon: Billey Co, MD;  Location: [facility] ORS;  Service: Urology;  Laterality: N/A;  . CYSTOSCOPY WITH LITHOLAPAXY N/A 07/06/2018   Procedure: CYSTOSCOPY WITH LITHOLAPAXY;  Surgeon: Billey Co, MD;  Location: [facility] ORS;  Service: Urology;  Laterality: N/A;  . CYSTOSCOPY WITH STENT PLACEMENT Right 05/29/2018   Procedure: CYSTOSCOPY WITH STENT PLACEMENT;  Surgeon: Billey Co, MD;  Location: [facility] ORS;  Service: Urology;  Laterality: Right;  . CYSTOSCOPY/URETEROSCOPY/HOLMIUM LASER Right 07/06/2018   Procedure: CYSTOSCOPY/URETEROSCOPY/HOLMIUM LASER;  Surgeon: Billey Co, MD;  Location: [facility] ORS;  Service: Urology;  Laterality: Right;  . EYE SURGERY Bilateral 2014   blepharoplasty  . 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 prostatic artery embolization  2018   while at [facility]  . SHOULDER SURGERY Left 1982   arthroscopy  . TONSILLECTOMY AND ADENOIDECTOMY     as a child    Home Medications:  Allergies as of 02/21/2020   No Known Allergies     Medication List       Accurate as of February 21, 2020 11:59 AM. If you have any questions, ask your nurse or doctor.        tamsulosin 0.4 MG Caps capsule Commonly known as: FLOMAX Take 1 capsule (0.4 mg total) by mouth daily.       Allergies:  No Known Allergies  Family History: Family History  Problem Relation Age of  Onset  . Cancer Mother        lung  . Cancer Father        throat  . Prostate cancer Neg Hx   . Kidney cancer Neg Hx   . Bladder Cancer Neg Hx     Social History:   reports that he has never smoked. He has never used smokeless tobacco. He reports current alcohol use of about 1.0 standard drink of alcohol per week. He reports that he does not use drugs.  Physical Exam: BP (!) 165/88   Pulse 90   Temp 98.3 F  (36.8 C) (Oral)   Ht 5' 9.5" (1.765 m)   Wt 173 lb (78.5 kg)   BMI 25.18 kg/m   Constitutional:  Alert and oriented, no acute distress, nontoxic appearing HEENT: [place], AT Cardiovascular: No clubbing, cyanosis, or edema Respiratory: Normal respiratory effort, no increased work of breathing Skin: No rashes, bruises or suspicious lesions Neurologic: Grossly intact, no focal deficits, moving all 4 extremities Psychiatric: Normal mood and affect  Laboratory Data: Results for orders placed or performed in visit on 02/21/20  CULTURE, URINE COMPREHENSIVE   Specimen: Urine   UR  Result Value Ref Range   Urine Culture, Comprehensive Preliminary report    Organism ID, Bacteria Comment   Microscopic Examination   Urine  Result Value Ref Range   WBC, UA 0-5 0 - 5 /hpf   RBC >30 (A) 0 - 2 /hpf   Epithelial Cells (non renal) 0-10 0 - 10 /hpf   Bacteria, UA Few None seen/Few  Urinalysis, Complete  Result Value Ref Range   Specific Gravity, UA <1.005 (L) 1.005 - 1.030   pH, UA CANCELED    Color, UA Red (A) Yellow   Appearance Ur Cloudy (A) Clear   Protein,UA CANCELED    Glucose, UA CANCELED    Ketones, UA CANCELED    Microscopic Examination See below:   Bladder Scan (Post Void Residual) in office  Result Value Ref Range   Scan Result 118    Assessment & Plan:   1. Dysuria Urine microscopy reassuring for infection; will send for culture for further evaluation. Counseled patient to keep pushing fluids and use Azo for symptomatic relief pending culture results.  Counseled patient to continue CIC as needed for sensation of incomplete voiding. Additional catheter samples provided today. Recommend cystoscopy for further evaluation of his dysuria and worsening LUTS; can defer this if urine culture is positive and sx improve on culture-appropriate abx. - Urinalysis, Complete - Bladder Scan (Post Void Residual) in office - CULTURE, URINE COMPREHENSIVE  2. Hematuria, gross Likely prostatic in  origin with onset following CIC. I counseled the patient on signs and symptoms of gross hematuria requiring urgent evaluation, including thick, ketchup-like urinary output; passage of large clots; dark, maroon-colored urine; lower abdominal pain; lumbar pain; abdominal distention; and the inability to urinate.  I advised him to contact the office for assistance if he develops these symptoms during routine office hours, 8 AM to 5 PM Monday through Friday.  If outside those hours, I advised him to proceed to the emergency department. He expressed understanding.   Return in about 2 weeks (around 03/06/2020) for Cystoscopy with Dr. Diamantina Providence.  Debroah Loop, PA-C  [facility] [address] [place], [place] [address] [phone]

## 2020-02-21 NOTE — Patient Instructions (Addendum)
1. Stay well hydrated. 2. Continue to self-cath as needed for the sensation of incomplete emptying. 3. I will send your urine for culture today and call you with your results. In the meantime, I recommend Azo Urinary Pain Relief, available over-the-counter at your local pharmacy, to treat your burning. Do not take this medication for longer than two days continuously. 4. If you develop thick, ketchup-like urinary output, start passing large blood clots, or become unable to empty your bladder either by yourself or through self catheterization, contact our office immediately or go to the Emergency Department if we are not open.

## 2020-02-21 NOTE — Telephone Encounter (Signed)
Patient's wife called stating that patient is experiencing frequency and dysuria and is concerned that he has a UTI. He would like to come in a drop of a UA. He was added on to the PA schedule for further evaluation

## 2020-02-27 ENCOUNTER — Other Ambulatory Visit: Payer: Self-pay | Admitting: Physician Assistant

## 2020-02-27 ENCOUNTER — Other Ambulatory Visit: Payer: Self-pay | Admitting: Urology

## 2020-02-27 LAB — CULTURE, URINE COMPREHENSIVE

## 2020-02-27 NOTE — Telephone Encounter (Signed)
Please contact the patient and inform him that I would like to start him on an antibiotic based on his urine culture results in advance of his cystoscopy next week.  I would like to send in Bactrim DS twice daily x7 days.  Please confirm his pharmacy.

## 2020-02-27 NOTE — Telephone Encounter (Signed)
LMOM for patient to please return call to verify pharmacy.

## 2020-03-03 MED ORDER — SULFAMETHOXAZOLE-TRIMETHOPRIM 800-160 MG PO TABS: 1.0000 | ORAL_TABLET | Freq: Two times a day (BID) | ORAL | 0 refills | Status: AC

## 2020-03-04 ENCOUNTER — Ambulatory Visit (INDEPENDENT_AMBULATORY_CARE_PROVIDER_SITE_OTHER): Payer: Medicare Other | Admitting: Urology

## 2020-03-04 ENCOUNTER — Other Ambulatory Visit: Payer: Self-pay

## 2020-03-04 ENCOUNTER — Encounter: Payer: Self-pay | Admitting: Urology

## 2020-03-04 VITALS — BP 132/79 | HR 80 | Ht 69.0 in | Wt 169.0 lb

## 2020-03-04 DIAGNOSIS — N401 Enlarged prostate with lower urinary tract symptoms: Secondary | ICD-10-CM | POA: Insufficient documentation

## 2020-03-04 DIAGNOSIS — N138 Other obstructive and reflux uropathy: Secondary | ICD-10-CM | POA: Diagnosis not present

## 2020-03-04 DIAGNOSIS — R310 Gross hematuria: Secondary | ICD-10-CM

## 2020-03-04 NOTE — Progress Notes (Signed)
03/04/2020 2:41 PM   Steven Coleman 16-Nov-1950 [id number]  Reason for visit: Follow up BPH, gross hematuria, history of bladder stones and nephrolithiasis  HPI: I saw Steven Coleman back in urology clinic today for the above issues.  He was originally on my schedule for cystoscopy today, but I felt cystoscopy was not indicated and wanted to discuss with him first.  Briefly, I originally met him in the fall 2019 when he had an infected right ureteral stone and required urgent stent placement.  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  He has a history of a 130 g prostate despite prior prostatic artery embolization at [facility] in 2018.  He very rarely has to catheterize, and is happy with his urinary symptoms currently.  He also has a history of elevated PSA, including a peak of 20 during an acute episode of urinary retention.  This down trended to 6 and follow-up with a reassuring PSA density, and he deferred further evaluation with prostate biopsy, or further PSA screening based on the AUA guidelines and age, and the fact that his significantly enlarged prostate was the most likely etiology of an elevated PSA.  He was recently seen by our PA Debroah Loop on 02/21/2020 for significant gross hematuria.  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  He push fluids it resolved.  He did have a urinalysis and culture done with her on 7/2 that ultimately grew 10-25k Staphylococcus warneri.  He was started on Bactrim last night.  Urinalysis today shows persistent infection with 11-30 WBCs, 11-30 RBCs, many bacteria, 1+ leukocytes, nitrite negative.  He would like to defer cystoscopy today which is not unreasonable.  We discussed the low, but nonzero, risk of  missing a clinically significant finding like a bladder tumor.  We discussed the most likely etiology of his gross hematuria as his significantly enlarged prostate and/or UTI.  We again had a long conversation about HOLEP as a option to improve his urination, remove his friable prostate tissue, and prevent further urinary tract infections or gross hematuria.  He continues to remain hesitant as he feels like he is doing all right at this time, but he understands that if he has recurrent gross hematuria or UTIs, he may need to pursue this as a treatment strategy. We discussed the risks and benefits of HoLEP at length.  The procedure requires general anesthesia and takes 2 to 3 hours, and a holmium laser is used to enucleate the prostate and push this tissue into the bladder.  A morcellator is then used to remove this tissue, which is sent for pathology.  The vast majority of patients are able to discharge the same day with a catheter in place for 2 to 3 days, and will follow-up in clinic for a voiding trial.  Approximately 5% of patients will be admitted overnight to monitor the urine, or if they have multiple co-morbidities.  We specifically discussed the risks of bleeding, infection, retrograde ejaculation, temporary urgency and urge incontinence, very low risk of long-term incontinence, pathologic evaluation of prostate tissue and possible detection of prostate cancer or other malignancy, and possible need for additional procedures.  Continue Bactrim DS twice daily Call if recurrent gross hematuria or UTIs RTC 6 months  with PVR and symptom check  Billey Co, MD  [facility] [address], [place], [place] [address] [phone]

## 2020-03-04 NOTE — Patient Instructions (Signed)

## 2020-03-05 LAB — URINALYSIS, COMPLETE
Bilirubin, UA: NEGATIVE
Glucose, UA: NEGATIVE
Ketones, UA: NEGATIVE
Nitrite, UA: NEGATIVE
Protein,UA: NEGATIVE
Specific Gravity, UA: <1.005 — ABNORMAL LOW (ref 1.005–1.030)
Urobilinogen, Ur: 0.2 mg/dL (ref 0.2–1.0)
pH, UA: 5 (ref 5.0–7.5)

## 2020-03-05 LAB — MICROSCOPIC EXAMINATION

## 2020-09-07 ENCOUNTER — Ambulatory Visit: Payer: Self-pay | Admitting: Urology

## 2020-12-04 ENCOUNTER — Other Ambulatory Visit: Payer: Self-pay | Admitting: Urology

## 2020-12-04 DIAGNOSIS — N401 Enlarged prostate with lower urinary tract symptoms: Secondary | ICD-10-CM

## 2020-12-16 ENCOUNTER — Ambulatory Visit: Payer: Self-pay | Admitting: Urology

## 2020-12-16 ENCOUNTER — Ambulatory Visit (INDEPENDENT_AMBULATORY_CARE_PROVIDER_SITE_OTHER): Payer: Medicare Other | Admitting: Urology

## 2020-12-16 ENCOUNTER — Encounter: Payer: Self-pay | Admitting: Urology

## 2020-12-16 ENCOUNTER — Other Ambulatory Visit: Payer: Self-pay

## 2020-12-16 VITALS — BP 157/84 | HR 87 | Ht 69.0 in | Wt 171.0 lb

## 2020-12-16 DIAGNOSIS — N401 Enlarged prostate with lower urinary tract symptoms: Secondary | ICD-10-CM

## 2020-12-16 DIAGNOSIS — N200 Calculus of kidney: Secondary | ICD-10-CM | POA: Diagnosis not present

## 2020-12-16 DIAGNOSIS — N138 Other obstructive and reflux uropathy: Secondary | ICD-10-CM

## 2020-12-16 LAB — BLADDER SCAN AMB NON-IMAGING

## 2020-12-16 MED ORDER — TAMSULOSIN HCL 0.4 MG PO CAPS: 0.4000 mg | ORAL_CAPSULE | Freq: Every day | ORAL | 3 refills | Status: DC

## 2020-12-16 NOTE — Patient Instructions (Signed)

## 2020-12-16 NOTE — Progress Notes (Signed)
   12/16/2020 12:01 PM   Steven Coleman [birth year] [id number]  Reason for visit: Follow up BPH, history of nephrolithiasis and bladder stones  HPI: 70 year old very healthy male who I originally met in fall 2019 when he had an infected right ureteral stone that required urgent stent placement.  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  He has a history of a 130 g prostate despite prior prostatic artery embolization at [facility] in 2018.    He is satisfied with his urinary symptoms at this time, and IPSS score is 7, with quality of life pleased and PVR is normal at 67 mL.  He remains on Flomax.  He also has a history of elevated PSA, including a peak of 20 during an acute episode of urinary retention.  This down trended to 6 in follow-up with a reassuring PSA density, and he deferred further evaluation with prostate biopsy, or further PSA screening based on the AUA guidelines and age, and the fact that his significantly enlarged prostate was the most likely etiology of an elevated PSA.  He denies any flank pain, gross hematuria, or kidney stone since our last visit, and he really has no complaints today.  Return precautions discussed extensively Flomax refilled RTC 1 year PVR and [place], MD  [place] [address], [place], [place] [address] [phone]

## 2021-12-16 ENCOUNTER — Ambulatory Visit
Admission: RE | Admit: 2021-12-16 | Discharge: 2021-12-16 | Disposition: A | Discharge status: Home / Self Care | Payer: Medicare Other | Attending: Urology | Admitting: Urology

## 2021-12-16 ENCOUNTER — Ambulatory Visit (INDEPENDENT_AMBULATORY_CARE_PROVIDER_SITE_OTHER): Payer: Medicare Other | Admitting: Urology

## 2021-12-16 ENCOUNTER — Encounter: Payer: Self-pay | Admitting: Urology

## 2021-12-16 ENCOUNTER — Ambulatory Visit
Admission: RE | Admit: 2021-12-16 | Discharge: 2021-12-16 | Disposition: A | Discharge status: Home / Self Care | Payer: Medicare Other | Source: Ambulatory Visit | Attending: Urology | Admitting: Urology

## 2021-12-16 VITALS — BP 158/94 | HR 71 | Ht 69.0 in | Wt 176.0 lb

## 2021-12-16 DIAGNOSIS — N210 Calculus in bladder: Secondary | ICD-10-CM | POA: Diagnosis not present

## 2021-12-16 DIAGNOSIS — N138 Other obstructive and reflux uropathy: Secondary | ICD-10-CM | POA: Insufficient documentation

## 2021-12-16 DIAGNOSIS — N401 Enlarged prostate with lower urinary tract symptoms: Secondary | ICD-10-CM | POA: Diagnosis not present

## 2021-12-16 DIAGNOSIS — I878 Other specified disorders of veins: Secondary | ICD-10-CM | POA: Diagnosis not present

## 2021-12-16 DIAGNOSIS — Z87442 Personal history of urinary calculi: Secondary | ICD-10-CM | POA: Diagnosis not present

## 2021-12-16 LAB — BLADDER SCAN AMB NON-IMAGING

## 2021-12-16 MED ORDER — TAMSULOSIN HCL 0.4 MG PO CAPS: 0.4000 mg | ORAL_CAPSULE | Freq: Every day | ORAL | 3 refills | Status: DC

## 2021-12-16 NOTE — Patient Instructions (Signed)
Dietary Guidelines to Help Prevent Kidney Stones Kidney stones are deposits of minerals and salts that form inside your kidneys. Your risk of developing kidney stones may be greater depending on your diet, your lifestyle, the medicines you take, and whether you have certain medical conditions. Most people can lower their chances of developing kidney stones by following the instructions below. Your dietitian may give you more specific instructions depending on your overall health and the type of kidney stones you tend to develop. What are tips for following this plan? Reading food labels  Choose foods with "no salt added" or "low-salt" labels. Limit your salt (sodium) intake to less than 1,500 mg a day. Choose foods with calcium for each meal and snack. Try to eat about 300 mg of calcium at each meal. Foods that contain 200-500 mg of calcium a serving include: 8 oz (237 mL) of milk, calcium-fortifiednon-dairy milk, and calcium-fortifiedfruit juice. Calcium-fortified means that calcium has been added to these drinks. 8 oz (237 mL) of kefir, yogurt, and soy yogurt. 4 oz (114 g) of tofu. 1 oz (28 g) of cheese. 1 cup (150 g) of dried figs. 1 cup (91 g) of cooked broccoli. One 3 oz (85 g) can of sardines or mackerel. Most people need 1,000-1,500 mg of calcium a day. Talk to your dietitian about how much calcium is recommended for you. Shopping Buy plenty of fresh fruits and vegetables. Most people do not need to avoid fruits and vegetables, even if these foods contain nutrients that may contribute to kidney stones. When shopping for convenience foods, choose: Whole pieces of fruit. Pre-made salads with dressing on the side. Low-fat fruit and yogurt smoothies. Avoid buying frozen meals or prepared deli foods. These can be high in sodium. Look for foods with live cultures, such as yogurt and kefir. Choose high-fiber grains, such as whole-wheat breads, oat bran, and wheat cereals. Cooking Do not add  salt to food when cooking. Place a salt shaker on the table and allow each person to add his or her own salt to taste. Use vegetable protein, such as beans, textured vegetable protein (TVP), or tofu, instead of meat in pasta, casseroles, and soups. Meal planning Eat less salt, if told by your dietitian. To do this: Avoid eating processed or pre-made food. Avoid eating fast food. Eat less animal protein, including cheese, meat, poultry, or fish, if told by your dietitian. To do this: Limit the number of times you have meat, poultry, fish, or cheese each week. Eat a diet free of meat at least 2 days a week. Eat only one serving each day of meat, poultry, fish, or seafood. When you prepare animal protein, cut pieces into small portion sizes. For most meat and fish, one serving is about the size of the palm of your hand. Eat at least five servings of fresh fruits and vegetables each day. To do this: Keep fruits and vegetables on hand for snacks. Eat one piece of fruit or a handful of berries with breakfast. Have a salad and fruit at lunch. Have two kinds of vegetables at dinner. Limit foods that are high in a substance called oxalate. These include: Spinach (cooked), rhubarb, beets, sweet potatoes, and Swiss chard. Peanuts. Potato chips, french fries, and baked potatoes with skin on. Nuts and nut products. Chocolate. If you regularly take a diuretic medicine, make sure to eat at least 1 or 2 servings of fruits or vegetables that are high in potassium each day. These include: Avocado. Banana. Orange, prune,   carrot, or tomato juice. Baked potato. Cabbage. Beans and split peas. Lifestyle  Drink enough fluid to keep your urine pale yellow. This is the most important thing you can do. Spread your fluid intake throughout the day. If you drink alcohol: Limit how much you use to: 0-1 drink a day for women who are not pregnant. 0-2 drinks a day for men. Be aware of how much alcohol is in your  drink. In the [place] one drink equals one 12 oz bottle of beer (355 mL), one 5 oz glass of wine (148 mL), or one 1 oz glass of hard liquor (44 mL). Lose weight if told by your health care provider. Work with your dietitian to find an eating plan and weight loss strategies that work best for you. General information Talk to your health care provider and dietitian about taking daily supplements. You may be told the following depending on your health and the cause of your kidney stones: Not to take supplements with vitamin C. To take a calcium supplement. To take a daily probiotic supplement. To take other supplements such as magnesium, fish oil, or vitamin B6. Take over-the-counter and prescription medicines only as told by your health care provider. These include supplements. What foods should I limit? Limit your intake of the following foods, or eat them as told by your dietitian. Vegetables Spinach. Rhubarb. Beets. Canned vegetables. Pickles. Olives. Baked potatoes with skin. Grains Wheat bran. Baked goods. Salted crackers. Cereals high in sugar. Meats and other proteins Nuts. Nut butters. Large portions of meat, poultry, or fish. Salted, precooked, or cured meats, such as sausages, meat loaves, and hot dogs. Dairy Cheese. Beverages Regular soft drinks. Regular vegetable juice. Seasonings and condiments Seasoning blends with salt. Salad dressings. Soy sauce. Ketchup. Barbecue sauce. Other foods Canned soups. Canned pasta sauce. Casseroles. Pizza. Lasagna. Frozen meals. Potato chips. French fries. The items listed above may not be a complete list of foods and beverages you should limit. Contact a dietitian for more information. What foods should I avoid? Talk to your dietitian about specific foods you should avoid based on the type of kidney stones you have and your overall health. Fruits Grapefruit. The item listed above may not be a complete list of foods and beverages you should  avoid. Contact a dietitian for more information. Summary Kidney stones are deposits of minerals and salts that form inside your kidneys. You can lower your risk of kidney stones by making changes to your diet. The most important thing you can do is drink enough fluid. Drink enough fluid to keep your urine pale yellow. Talk to your dietitian about how much calcium you should have each day, and eat less salt and animal protein as told by your dietitian. This information is not intended to replace advice given to you by your health care provider. Make sure you discuss any questions you have with your health care provider. Document Revised: 04/19/2021 Document Reviewed: 04/19/2021 Elsevier Patient Education  2023 Elsevier Inc.  

## 2021-12-16 NOTE — Progress Notes (Signed)
? ?  12/16/2021 ?9:17 AM  ? ?Steven Coleman ?1951-02-25 ?[id number] ? ?Reason for visit: Follow up BPH, history of nephrolithiasis and bladder stones ? ?HPI: ?71 year old very healthy male who I originally met in fall 2019 when he had an infected right ureteral stone that required urgent stent placement.  He underwent follow-up removal of the stone as well as cystolitholapaxy for a 2.5 cm bladder stone, and biopsy of a small bladder lesion that ultimately showed only benign polypoid tissue with no evidence of malignancy.  He has a history of a 130 g prostate despite prior prostatic artery embolization at [facility] in 2018.    He is satisfied with his urinary symptoms at this time, and remains on Flomax.  PVR is normal today at 140 mL.  He really denies any significant urinary complaint.  We discussed he could trial off the Flomax if he wanted in the future to see if his urinary symptoms worsened.  He denies any UTIs or gross hematuria. ? ?He also has a history of elevated PSA, including a peak of 20 during an acute episode of urinary retention.  This down trended to 6 in follow-up with a reassuring PSA density, and he deferred further evaluation with prostate biopsy, or further PSA screening based on the AUA guidelines and age, and the fact that his significantly enlarged prostate was the most likely etiology of an elevated PSA. ? ?He denies any stone episodes over the last year.   ? ?He had a his KUB after he left clinic, and I personally viewed and interpreted those images.  He appears to have multiple 1 to 2 cm layering bladder stones, no evidence of renal stones. ? ?We will contact him with those results, and I would recommend CT for further evaluation of his likely bladder stone burden, and rediscuss cystolitholapaxy and outlet procedures.  Alternatively, he could consider observation, but would be high risk for UTIs, retention, or worsening urinary symptoms, and he did develop UTI when he previously had a large  bladder stone. ? ?-Flomax refilled ?-Will contact with KUB results suspicious for multiple bladder stones and recommend CT for further evaluation, and consideration of repeat cystolitholapaxy and even HOLEP for his ongoing incomplete emptying ? ? ?Billey Co, MD ? ?[facility] ?[address] ?[place], [place] [address] ?[phone] ? ? ?

## 2021-12-17 ENCOUNTER — Telehealth: Payer: Self-pay

## 2021-12-17 DIAGNOSIS — N210 Calculus in bladder: Secondary | ICD-10-CM

## 2021-12-17 NOTE — Telephone Encounter (Signed)
Called pt, wife answers. Informed wife of all information per DPR. She states she will have pt call me back when he returns home. 1st attempt.  ?

## 2021-12-17 NOTE — Telephone Encounter (Signed)
KUB order placed

## 2021-12-17 NOTE — Telephone Encounter (Signed)
Patient returned the call. Information from Dr. Doristine Counter note was provided to the patient.   ? ?Patient advised that he would like to repeat the KUB in 6 months.  Follow up appt made for October with KUB prior.  ? ?Patient will call if he develops any symptoms. ?

## 2021-12-17 NOTE — Telephone Encounter (Signed)
-----   Message from Billey Co, MD sent at 12/16/2021  4:25 PM EDT ----- ?Regarding: KUB results ?Unfortunately it looks like he has multiple recurrent bladder stones on his KUB.  I would recommend a CT stone protocol for further evaluation of these bladder stones, as well as size of the prostate, and consideration of removal and even an outlet procedure like HOLEP. ? ?Since he is asymptomatic, if he would like to repeat an x-ray in 6 months I think that is reasonable, but if any blood in the urine, worsening urinary symptoms, or UTI he needs to let us know ? ?Nickolas Madrid, MD ?12/16/2021 ? ? ? ?

## 2022-05-18 ENCOUNTER — Ambulatory Visit
Admission: RE | Admit: 2022-05-18 | Discharge: 2022-05-18 | Disposition: A | Discharge status: Home / Self Care | Payer: Medicare Other | Source: Ambulatory Visit | Attending: Urology | Admitting: Urology

## 2022-05-18 ENCOUNTER — Ambulatory Visit
Admission: RE | Admit: 2022-05-18 | Discharge: 2022-05-18 | Disposition: A | Discharge status: Home / Self Care | Payer: Medicare Other | Attending: Urology | Admitting: Urology

## 2022-05-18 DIAGNOSIS — I878 Other specified disorders of veins: Secondary | ICD-10-CM | POA: Diagnosis not present

## 2022-05-18 DIAGNOSIS — N210 Calculus in bladder: Secondary | ICD-10-CM | POA: Diagnosis not present

## 2022-05-18 DIAGNOSIS — M160 Bilateral primary osteoarthritis of hip: Secondary | ICD-10-CM | POA: Diagnosis not present

## 2022-06-15 ENCOUNTER — Ambulatory Visit (INDEPENDENT_AMBULATORY_CARE_PROVIDER_SITE_OTHER): Payer: Medicare Other | Admitting: Urology

## 2022-06-15 ENCOUNTER — Encounter: Payer: Self-pay | Admitting: Urology

## 2022-06-15 VITALS — BP 170/95 | HR 75 | Ht 69.5 in | Wt 175.0 lb

## 2022-06-15 DIAGNOSIS — N401 Enlarged prostate with lower urinary tract symptoms: Secondary | ICD-10-CM

## 2022-06-15 DIAGNOSIS — R972 Elevated prostate specific antigen [PSA]: Secondary | ICD-10-CM

## 2022-06-15 DIAGNOSIS — N210 Calculus in bladder: Secondary | ICD-10-CM | POA: Diagnosis not present

## 2022-06-15 DIAGNOSIS — N138 Other obstructive and reflux uropathy: Secondary | ICD-10-CM

## 2022-06-15 DIAGNOSIS — Z87898 Personal history of other specified conditions: Secondary | ICD-10-CM

## 2022-06-15 LAB — BLADDER SCAN AMB NON-IMAGING

## 2022-06-15 NOTE — Progress Notes (Addendum)
   06/15/2022 8:32 AM   Steven Coleman [birth year] [id number]  Reason for visit: Follow up BPH, incomplete emptying/retention, history of nephrolithiasis, and bladder stones  HPI: 71 year old very healthy male who I originally met in Fall 2019 when he had an infected right ureteral stone that required urgent stent placement.  He underwent follow-up removal of the stone as well as cystolitholapaxy for a 2.5 cm bladder stone, and biopsy of a small bladder lesion that ultimately showed only benign polypoid tissue with no evidence of malignancy.  He has a history of a 130 g prostate despite prior prostatic artery embolization at [facility] in 2018.  He remains on Flomax 0.4 mg nightly.  He really denies any urinary complaints today, no hematuria or UTIs.  At our last visit in April 2023 he was noted to have multiple bladder stones on KUB.  I recommended CT for further evaluation and consideration of cystolitholapaxy, but he deferred.  He remains very resistant to any outlet procedures secondary to potential risk of incontinence.  He understands the risk of UTIs, retention, and renal failure.  He actually is catheterizing typically twice daily. He has had trouble with a straight tip catheter and uses an angled catheter.  I personally viewed and interpreted the repeat KUB from 05/18/2022 that shows stable multiple bladder stones.  We had another long conversation today about his bladder stones on KUB with likely incomplete emptying, mildly elevated PVR of 188 mL today.  We discussed the risks of retention/obstruction, high-pressure bladder with upstream hydronephrosis and renal failure, UTI/sepsis from urinary source.  He understands these risks and would like to continue catheterizing on an as needed/every other day basis.  Return precautions were discussed extensively.   He also has a history of elevated PSA, including a peak of 20 during an acute episode of urinary retention.  This down trended to 6 in  follow-up with a reassuring PSA density, and he deferred further evaluation with prostate biopsy, or further PSA screening based on the AUA guidelines and age, and the fact that his significantly enlarged prostate was the most likely etiology of an elevated PSA.   -Flomax refilled -Catheters refilled for CIC every 1 to 2 days -RTC 6 months BMP prior, PVR  Billey Co, MD  [facility] [address], [place], [place] [address] [phone]

## 2022-07-06 ENCOUNTER — Telehealth: Payer: Self-pay | Admitting: *Deleted

## 2022-07-06 NOTE — Telephone Encounter (Signed)
[facility] calling asking for an addendum to the 06/15/2022 note, it needs to say: - that he is catheterizing twice daily - diagnosis of urinary retention or incontinence -that he is unable to pass a straight tip catheter

## 2022-07-07 NOTE — Telephone Encounter (Signed)
Addended note faxed back to [facility]

## 2022-07-12 NOTE — Progress Notes (Signed)
Patient is unable to pass a straight tip catheter due BPH with urinary obstruction therefore patient must use coude catheter twice daily.

## 2022-12-12 ENCOUNTER — Other Ambulatory Visit: Payer: Medicare Other

## 2022-12-12 DIAGNOSIS — N210 Calculus in bladder: Secondary | ICD-10-CM

## 2022-12-13 LAB — BASIC METABOLIC PANEL
BUN/Creatinine Ratio: 15 (ref 10–24)
BUN: 16 mg/dL (ref 8–27)
CO2: 19 mmol/L — ABNORMAL LOW (ref 20–29)
Calcium: 9.5 mg/dL (ref 8.6–10.2)
Chloride: 103 mmol/L (ref 96–106)
Creatinine, Ser: 1.07 mg/dL (ref 0.76–1.27)
Glucose: 101 mg/dL — ABNORMAL HIGH (ref 70–99)
Potassium: 5.4 mmol/L — ABNORMAL HIGH (ref 3.5–5.2)
Sodium: 142 mmol/L (ref 134–144)
eGFR: 73 mL/min/{1.73_m2} (ref >59)

## 2022-12-15 ENCOUNTER — Encounter: Payer: Self-pay | Admitting: Urology

## 2022-12-15 ENCOUNTER — Other Ambulatory Visit: Payer: Self-pay | Admitting: Urology

## 2022-12-15 ENCOUNTER — Ambulatory Visit
Admission: RE | Admit: 2022-12-15 | Discharge: 2022-12-15 | Disposition: A | Discharge status: Home / Self Care | Payer: Medicare Other | Source: Ambulatory Visit | Attending: Urology | Admitting: Urology

## 2022-12-15 ENCOUNTER — Ambulatory Visit (INDEPENDENT_AMBULATORY_CARE_PROVIDER_SITE_OTHER): Payer: Medicare Other | Admitting: Urology

## 2022-12-15 ENCOUNTER — Ambulatory Visit
Admission: RE | Admit: 2022-12-15 | Discharge: 2022-12-15 | Disposition: A | Discharge status: Home / Self Care | Payer: Medicare Other | Attending: Urology | Admitting: Urology

## 2022-12-15 VITALS — BP 167/91 | HR 88 | Ht 69.5 in | Wt 175.0 lb

## 2022-12-15 DIAGNOSIS — N200 Calculus of kidney: Secondary | ICD-10-CM

## 2022-12-15 DIAGNOSIS — Z87448 Personal history of other diseases of urinary system: Secondary | ICD-10-CM

## 2022-12-15 DIAGNOSIS — N401 Enlarged prostate with lower urinary tract symptoms: Secondary | ICD-10-CM | POA: Diagnosis not present

## 2022-12-15 DIAGNOSIS — Z87442 Personal history of urinary calculi: Secondary | ICD-10-CM

## 2022-12-15 LAB — BLADDER SCAN AMB NON-IMAGING

## 2022-12-15 MED ORDER — TAMSULOSIN HCL 0.4 MG PO CAPS: 0.4000 mg | ORAL_CAPSULE | Freq: Every day | ORAL | 3 refills | Status: DC

## 2022-12-15 NOTE — Progress Notes (Signed)
   12/15/2022 12:35 PM   Steven Coleman 17-Oct-1950 [id number]  Reason for visit: Follow up BPH, incomplete emptying/retention, history of nephrolithiasis and bladder stones  HPI: 72 year old very healthy male who I originally met in Fall 2019 when he had an infected right ureteral stone that required urgent stent placement.  He underwent follow-up removal of the stone as well as cystolitholapaxy for a 2.5 cm bladder stone, and biopsy of a small bladder lesion that ultimately showed only benign polypoid tissue with no evidence of malignancy.  He has a history of a 130 g prostate despite prior prostatic artery embolization at [facility] in 2018.  He remains on Flomax 0.4 mg nightly.  He catheterizes 1-2 times daily, and voids spontaneously in between.  He has no complaints about his urinary symptoms at this time.  At our visit in April 2023 was noted to have multiple bladder stones on KUB.  I recommended CT and consideration of cystolitholapaxy and outlet procedure with HOLEP, but he deferred.  He feels his bladder is currently managed well with the CIC and the intermittent voiding, renal function remains normal with creatinine 1.07, EGFR greater than 60 from April 2024.  Previously had recommended a renal ultrasound to evaluate any hydronephrosis but this was never completed.  He understands the risk of UTIs, retention, and renal failure, and I think he has a very good understanding of his current situation.  He continues to deny any dysuria, gross hematuria, or UTIs over the last year.  We again reviewed HOLEP and cystolitholapaxy as a more definitive treatment option, but he is currently satisfied with urinary symptoms and defers any intervention at this time.  I personally viewed and interpreted the KUB today that shows no evidence of kidney stones, but stable multiple bladder stones.  He also has a history of elevated PSA, including a peak of 20 during an acute episode of urinary retention.  This down  trended to 6 in follow-up with a reassuring PSA density, and he deferred further evaluation with prostate biopsy, or further PSA screening based on the AUA guidelines and age, and the fact that his significantly enlarged prostate was the most likely etiology of an elevated PSA.  -Flomax refilled -CIC BID -RTC 1 yr BMP, PVR  Sondra Come, MD  [facility] [address] [place], [place] [address] [phone]

## 2022-12-15 NOTE — Patient Instructions (Signed)

## 2023-06-30 ENCOUNTER — Telehealth: Payer: Self-pay

## 2023-06-30 NOTE — Telephone Encounter (Signed)
Pt is requesting a refill of Catheters.  Coude tip 16FR

## 2023-06-30 NOTE — Telephone Encounter (Signed)
Order for catheter refills faxed to [facility].

## 2023-07-13 NOTE — Telephone Encounter (Signed)
Called pt informed him that [facility] manages our ordered and contacts [facility] for [place]. Called [facility] to confirm that they did receive order. They state that the need notes confirming he needs coude. Notes faxed to [phone].   Pt informed.

## 2023-07-13 NOTE — Telephone Encounter (Signed)
Patient dropped in office and said that prescription for catheter refill needs to be sent to [facility] and not [facility]. He spoke with [facility] and they told him that they had sent a request to our office on 11/11 and 11/19 for the prescription and signed order for special coude tip. Patient requested that he be called to confirm when this is done.

## 2023-07-18 ENCOUNTER — Telehealth: Payer: Self-pay | Admitting: Urology

## 2023-07-18 NOTE — Telephone Encounter (Signed)
Patient dropped in office this morning and stated that [facility] is telling him that they have note received notes that were faxed on 07/13/23. Patient is asking if we can contact [facility] to follow up, or if there is another company that prescription can be sent to. He said he has been trying to renew for a month now. He also asked that our office call him to update.

## 2023-07-18 NOTE — Telephone Encounter (Signed)
I have faxed the office notes multiple times as previously documented. Will attempt again today. [facility] is the catheter company determined by his insurance therefore we are unable to change.

## 2023-07-19 NOTE — Progress Notes (Signed)
Pt is unable to pass a straight tip catheter due to BPH with obstruction. Therefore patient must use coude catheter twice daily.  Darol Destine, CMA

## 2023-07-19 NOTE — Telephone Encounter (Signed)
Pt states he is unable to get his catheters because [facility] is telling him the OV that was sent in was over a year old.   Steven Coleman with [facility]. She advised I addend the OV 4/24 to specify why pt needs coude catheter and fax to her.   Updated OV 4/24. Faxed to [place] with confirmation. Steven Coleman to have catheters shipped today to pt.   Pt aware.

## 2023-12-15 ENCOUNTER — Other Ambulatory Visit: Payer: Self-pay

## 2023-12-15 DIAGNOSIS — N200 Calculus of kidney: Secondary | ICD-10-CM

## 2023-12-15 DIAGNOSIS — N401 Enlarged prostate with lower urinary tract symptoms: Secondary | ICD-10-CM

## 2023-12-15 DIAGNOSIS — Z87448 Personal history of other diseases of urinary system: Secondary | ICD-10-CM

## 2023-12-16 LAB — BASIC METABOLIC PANEL WITH GFR
BUN/Creatinine Ratio: 16 (ref 10–24)
BUN: 17 mg/dL (ref 8–27)
CO2: 21 mmol/L (ref 20–29)
Calcium: 9.8 mg/dL (ref 8.6–10.2)
Chloride: 102 mmol/L (ref 96–106)
Creatinine, Ser: 1.07 mg/dL (ref 0.76–1.27)
Glucose: 95 mg/dL (ref 70–99)
Potassium: 5.2 mmol/L (ref 3.5–5.2)
Sodium: 140 mmol/L (ref 134–144)
eGFR: 73 mL/min/{1.73_m2} (ref >59)

## 2023-12-21 ENCOUNTER — Other Ambulatory Visit: Payer: Self-pay

## 2023-12-21 ENCOUNTER — Ambulatory Visit
Admission: RE | Admit: 2023-12-21 | Discharge: 2023-12-21 | Disposition: A | Discharge status: Home / Self Care | Attending: Urology | Admitting: Urology

## 2023-12-21 ENCOUNTER — Ambulatory Visit
Admission: RE | Admit: 2023-12-21 | Discharge: 2023-12-21 | Disposition: A | Discharge status: Home / Self Care | Source: Ambulatory Visit | Attending: Urology | Admitting: Urology

## 2023-12-21 ENCOUNTER — Ambulatory Visit (INDEPENDENT_AMBULATORY_CARE_PROVIDER_SITE_OTHER): Payer: Self-pay | Admitting: Urology

## 2023-12-21 VITALS — BP 159/94 | HR 82 | Ht 74.0 in | Wt 175.0 lb

## 2023-12-21 DIAGNOSIS — N401 Enlarged prostate with lower urinary tract symptoms: Secondary | ICD-10-CM

## 2023-12-21 DIAGNOSIS — N200 Calculus of kidney: Secondary | ICD-10-CM

## 2023-12-21 DIAGNOSIS — N138 Other obstructive and reflux uropathy: Secondary | ICD-10-CM | POA: Diagnosis not present

## 2023-12-21 LAB — BLADDER SCAN AMB NON-IMAGING: Scan Result: 104

## 2023-12-21 MED ORDER — TAMSULOSIN HCL 0.4 MG PO CAPS: 0.4000 mg | ORAL_CAPSULE | Freq: Every day | ORAL | 3 refills | Status: AC

## 2023-12-21 NOTE — Progress Notes (Signed)
   12/21/2023 10:09 AM   Steven Coleman 1951-08-16 [id number]  Reason for visit: Follow up BPH, incomplete bladder emptying, bladder stones  HPI: 73 year old very healthy male we have followed for the above issues.  He has a history of a 130 g prostate despite a prior prostate artery embolization at [facility] in 2018, remains on Flomax , and catheterizes twice daily.  He does void spontaneously in between.  He really has no complaints about his urinary symptoms and symptoms have been stable over the last few years.  He has known multiple bladder stones on KUB, and he has opted for surveillance alone.  We have previously discussed consideration of cystolitholapaxy and HOLEP extensively, and he has deferred intervention.  He denies any changes over the last year, specifically no gross hematuria, UTIs, or incontinence.  He does not have any nocturia.  Continues with catheterizations twice daily, using a coud catheter.  Renal function remains normal with recent creatinine 1.07, EGFR, >60.  I personally viewed and interpreted the KUB today that shows no evidence of renal stones, multiple bladder stones, stable from prior KUB.  He would like to continue surveillance.  -Flomax  refilled -Continue intermittent catheterization twice daily with coud catheter -RTC 1 year KUB prior for surveillance of bladder stones   Lawerence Pressman, MD  [facility] [address] [place], [place] [address] [phone]

## 2024-04-24 ENCOUNTER — Encounter: Payer: Self-pay | Admitting: Urology

## 2024-06-28 ENCOUNTER — Telehealth: Payer: Self-pay

## 2024-06-28 NOTE — Telephone Encounter (Signed)
 Order form faxed to [facility] along with most recent office notes.

## 2024-07-09 ENCOUNTER — Telehealth: Payer: Self-pay

## 2024-07-09 NOTE — Telephone Encounter (Signed)
 error

## 2024-12-25 ENCOUNTER — Ambulatory Visit: Admitting: Urology

## 2024-12-26 ENCOUNTER — Ambulatory Visit: Admitting: Urology
